# Patient Record
Sex: Male | Born: 1985 | Race: White | Hispanic: No | Marital: Married | Smoking: Former smoker
Health system: Southern US, Community
[De-identification: ages and names within clinical notes are randomized; demographics above are authoritative.]

## PROBLEM LIST (undated history)

## (undated) DIAGNOSIS — K219 Gastro-esophageal reflux disease without esophagitis: Secondary | ICD-10-CM

## (undated) DIAGNOSIS — Z8619 Personal history of other infectious and parasitic diseases: Secondary | ICD-10-CM

## (undated) DIAGNOSIS — A6920 Lyme disease, unspecified: Secondary | ICD-10-CM

## (undated) HISTORY — DX: Gastro-esophageal reflux disease without esophagitis: K21.9

## (undated) HISTORY — PX: ORIF FACIAL FRACTURE: SHX2118

## (undated) HISTORY — PX: WISDOM TOOTH EXTRACTION: SHX21

## (undated) HISTORY — DX: Personal history of other infectious and parasitic diseases: Z86.19

## (undated) HISTORY — DX: Lyme disease, unspecified: A69.20

---
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
## 2013-11-14 ENCOUNTER — Other Ambulatory Visit (HOSPITAL_BASED_OUTPATIENT_CLINIC_OR_DEPARTMENT_OTHER): Payer: Self-pay | Admitting: Osteopathic Medicine

## 2013-11-14 ENCOUNTER — Ambulatory Visit (HOSPITAL_BASED_OUTPATIENT_CLINIC_OR_DEPARTMENT_OTHER)
Admission: RE | Admit: 2013-11-14 | Discharge: 2013-11-14 | Disposition: A | Discharge status: Home / Self Care | Source: Ambulatory Visit | Attending: Osteopathic Medicine | Admitting: Osteopathic Medicine

## 2013-11-14 DIAGNOSIS — N4340 Spermatocele of epididymis, unspecified: Secondary | ICD-10-CM

## 2014-07-13 ENCOUNTER — Ambulatory Visit: Admitting: Physician Assistant

## 2014-07-17 ENCOUNTER — Encounter: Payer: Self-pay | Admitting: Physician Assistant

## 2014-07-17 ENCOUNTER — Ambulatory Visit (INDEPENDENT_AMBULATORY_CARE_PROVIDER_SITE_OTHER): Admitting: Physician Assistant

## 2014-07-17 VITALS — BP 129/71 | HR 65 | Temp 97.9°F | Resp 16 | Ht 74.25 in | Wt 199.1 lb

## 2014-07-17 DIAGNOSIS — N503 Cyst of epididymis: Secondary | ICD-10-CM

## 2014-07-17 DIAGNOSIS — R5382 Chronic fatigue, unspecified: Secondary | ICD-10-CM

## 2014-07-17 DIAGNOSIS — K219 Gastro-esophageal reflux disease without esophagitis: Secondary | ICD-10-CM

## 2014-07-17 LAB — COMPREHENSIVE METABOLIC PANEL
ALT: 33 U/L (ref 0–53)
AST: 21 U/L (ref 0–37)
Albumin: 4.5 g/dL (ref 3.5–5.2)
Alkaline Phosphatase: 58 U/L (ref 39–117)
BUN: 20 mg/dL (ref 6–23)
CO2: 27 mEq/L (ref 19–32)
Calcium: 9.7 mg/dL (ref 8.4–10.5)
Chloride: 104 mEq/L (ref 96–112)
Creatinine, Ser: 1.08 mg/dL (ref 0.40–1.50)
GFR: 87.22 mL/min (ref >60.00)
Glucose, Bld: 75 mg/dL (ref 70–99)
Potassium: 4.1 mEq/L (ref 3.5–5.1)
Sodium: 139 mEq/L (ref 135–145)
Total Bilirubin: 0.5 mg/dL (ref 0.2–1.2)
Total Protein: 7.1 g/dL (ref 6.0–8.3)

## 2014-07-17 LAB — CBC WITH DIFFERENTIAL/PLATELET
Basophils Absolute: 0 10*3/uL (ref 0.0–0.1)
Basophils Relative: 0.6 % (ref 0.0–3.0)
Eosinophils Absolute: 0.1 10*3/uL (ref 0.0–0.7)
Eosinophils Relative: 1.5 % (ref 0.0–5.0)
HCT: 44.8 % (ref 39.0–52.0)
Hemoglobin: 15.4 g/dL (ref 13.0–17.0)
Lymphocytes Relative: 34.9 % (ref 12.0–46.0)
Lymphs Abs: 1.8 10*3/uL (ref 0.7–4.0)
MCHC: 34.4 g/dL (ref 30.0–36.0)
MCV: 86.4 fl (ref 78.0–100.0)
Monocytes Absolute: 0.3 10*3/uL (ref 0.1–1.0)
Monocytes Relative: 5.1 % (ref 3.0–12.0)
Neutro Abs: 3 10*3/uL (ref 1.4–7.7)
Neutrophils Relative %: 57.9 % (ref 43.0–77.0)
Platelets: 185 10*3/uL (ref 150.0–400.0)
RBC: 5.18 Mil/uL (ref 4.22–5.81)
RDW: 12.4 % (ref 11.5–15.5)
WBC: 5.2 10*3/uL (ref 4.0–10.5)

## 2014-07-17 LAB — TSH: TSH: 1.7 u[IU]/mL (ref 0.35–4.50)

## 2014-07-17 NOTE — Progress Notes (Signed)
Patient presents to clinic today to establish care.  Acute Concerns: Patient complains of significant fatigue over the past few months.  Denies trauma or injury.  Denies anxiety or depression but states he feels stressed all the time due to work schedule.  Endorses good diet and fluid intake. Does endorses history of Lyme Disease which he states was treated with Doxycycline.  Denies myalgias or arthralgias.  Patient also endorses R testicular mass that has enlarged over the past couple of months.  Was seen at outside facility for this issue and was sent to our imaging department for US.  Upon record review there is evidence of a 4 mm epididymal cyst/spermatocele noted on R epididymus.  Patient denies tenderness or discomfort.  Is aware he is in the age range where testicular cancer is most common and wants to know if there is testing that can be done to assess this.  Chronic Issues: GERD -- Prilosec 40 mg daily.  Denies breakthrough symptoms -- nausea/vomiting or abdominal pain. Is trying to watch diet.  Is staying active.  Past Medical History  Diagnosis Date  . Acid reflux   . History of chicken pox   . Lyme disease     Unknown    Past Surgical History  Procedure Laterality Date  . Wisdom tooth extraction    . Orif facial fracture      Left Cheek    No current outpatient prescriptions on file prior to visit.   No current facility-administered medications on file prior to visit.    No Known Allergies  Family History  Problem Relation Age of Onset  . Healthy Mother     Living  . Healthy Father     Living  . Lung cancer Maternal Grandfather   . Throat cancer Maternal Grandfather   . Cancer Maternal Uncle   . Healthy Brother     x3    History   Social History  . Marital Status: Single    Spouse Name: N/A  . Number of Children: N/A  . Years of Education: N/A   Occupational History  . Not on file.   Social History Main Topics  . Smoking status: Former Smoker      Types: Cigarettes  . Smokeless tobacco: Never Used     Comment: Quit >5 yrs  . Alcohol Use: 0.0 oz/week    0 Standard drinks or equivalent per week     Comment: rare  . Drug Use: No  . Sexual Activity: Not on file   Other Topics Concern  . Not on file   Social History Narrative  . No narrative on file   ROS Pertinent ROS are listed in the HPI.  BP 129/71 mmHg  Pulse 65  Temp(Src) 97.9 F (36.6 C) (Oral)  Resp 16  Ht 6' 2.25" (1.886 m)  Wt 199 lb 2 oz (90.323 kg)  BMI 25.39 kg/m2  SpO2 100%  Physical Exam  Constitutional: He is oriented to person, place, and time and well-developed, well-nourished, and in no distress.  HENT:  Head: Normocephalic and atraumatic.  Eyes: Conjunctivae are normal.  Neck: Neck supple. No thyromegaly present.  Cardiovascular: Normal rate, regular rhythm, normal heart sounds and intact distal pulses.   Pulmonary/Chest: Effort normal and breath sounds normal. No respiratory distress. He has no wheezes. He has no rales. He exhibits no tenderness.  Genitourinary: Penis normal. He exhibits abnormal testicular mass. He exhibits no testicular tenderness, no scrotal tenderness and no epididymal tenderness. No discharge  found.  Lymphadenopathy:    He has no cervical adenopathy.  Neurological: He is alert and oriented to person, place, and time.  Skin: Skin is warm and dry. No rash noted.  Psychiatric: Affect normal.  Vitals reviewed.  Assessment/Plan: GERD (gastroesophageal reflux disease) Well-controlled.  Continue current regimen.  GERD diet discussed.   Epididymal cyst Noted on [place].  Lesion still present. Asymptomatic. Due to patient concerns, will obtain a urine pregnancy test to make sure negative for HCG.     Chronic fatigue Unclear -- likely due to stress.  Will obtain lab workup today.  If unremarkable, will refer to Psychology for formal ADD testing.

## 2014-07-17 NOTE — Assessment & Plan Note (Signed)
Unclear -- likely due to stress.  Will obtain lab workup today.  If unremarkable, will refer to Psychology for formal ADD testing.

## 2014-07-17 NOTE — Assessment & Plan Note (Signed)
Well-controlled.  Continue current regimen.  GERD diet discussed.

## 2014-07-17 NOTE — Assessment & Plan Note (Signed)
Noted on US.  Lesion still present. Asymptomatic. Due to patient concerns, will obtain a urine pregnancy test to make sure negative for HCG.

## 2014-07-17 NOTE — Progress Notes (Signed)
Pre visit review using our clinic review tool, if applicable. No additional management support is needed unless otherwise documented below in the visit note/SLS  

## 2014-07-17 NOTE — Patient Instructions (Signed)
Please go to the lab for blood work and a urine test.  I will call you with all of your results.  If a cause is found for your fatigue, we will treat you accordingly.  If all looks good, I recommend we proceed with the ADD testing.   Follow-up will be based on your results.

## 2014-07-18 LAB — PREGNANCY, URINE: Preg Test, Ur: NEGATIVE

## 2014-07-20 ENCOUNTER — Telehealth: Payer: Self-pay

## 2014-07-20 LAB — EPSTEIN-BARR VIRUS NUCLEAR ANTIGEN ANTIBODY, IGG: EBV NA IgG: 79.9 U/mL — ABNORMAL HIGH (ref <18.0)

## 2014-07-20 LAB — B. BURGDORFI ANTIBODIES: B burgdorferi Ab IgG+IgM: 0.47 {ISR}

## 2014-07-20 MED ORDER — OMEPRAZOLE 40 MG PO CPDR: 40.0000 mg | DELAYED_RELEASE_CAPSULE | Freq: Every day | ORAL | Status: DC

## 2014-07-20 NOTE — Telephone Encounter (Signed)
omeprazole (PRILOSEC) 40 MG capsule--Take 40 mg by mouth daily. - Oral  [facility] [address] per patient's request.

## 2014-07-21 ENCOUNTER — Ambulatory Visit: Admitting: Physician Assistant

## 2014-07-28 ENCOUNTER — Telehealth: Payer: Self-pay | Admitting: Physician Assistant

## 2014-07-28 DIAGNOSIS — F988 Other specified behavioral and emotional disorders with onset usually occurring in childhood and adolescence: Secondary | ICD-10-CM

## 2014-07-28 NOTE — Telephone Encounter (Signed)
Unfortunately it will have to run its course.  Can last up to 6 weeks.  Recommend he take a multivitamin, a probiotic and a zinc supplement. Order placed to psychology for further ADD testing.

## 2014-07-28 NOTE — Telephone Encounter (Signed)
LMOM with contact name and number for return call, if needed RE: mono symptoms, referrals and further provider instructions/SLS

## 2014-07-28 NOTE — Telephone Encounter (Signed)
Caller name: Chrissie Noawilliam Relation to pt: self Call back number: [phone] Pharmacy:  Reason for call:   Patient states that he was diagnosed with mono and is having a hard time with day to day work. He wants to know if there is anything that he can take to help the fatigue and to speed this illness up so he can feel better  Also, patient states that he would like to proceed with ADHD testing.

## 2014-08-10 ENCOUNTER — Encounter: Payer: Self-pay | Admitting: Physician Assistant

## 2014-08-10 ENCOUNTER — Ambulatory Visit (INDEPENDENT_AMBULATORY_CARE_PROVIDER_SITE_OTHER): Payer: Self-pay | Admitting: Physician Assistant

## 2014-08-10 VITALS — BP 124/72 | HR 66 | Temp 98.1°F | Resp 16 | Ht 74.25 in | Wt 198.1 lb

## 2014-08-10 DIAGNOSIS — R4589 Other symptoms and signs involving emotional state: Secondary | ICD-10-CM

## 2014-08-10 DIAGNOSIS — Z202 Contact with and (suspected) exposure to infections with a predominantly sexual mode of transmission: Secondary | ICD-10-CM

## 2014-08-10 DIAGNOSIS — F329 Major depressive disorder, single episode, unspecified: Secondary | ICD-10-CM

## 2014-08-10 LAB — POCT URINALYSIS DIPSTICK
Bilirubin, UA: NEGATIVE
Glucose, UA: NEGATIVE
Ketones, UA: NEGATIVE
Leukocytes, UA: NEGATIVE
NITRITE UA: NEGATIVE
PROTEIN UA: NEGATIVE
RBC UA: NEGATIVE
SPEC GRAV UA: 1.015
Urobilinogen, UA: 0.2
pH, UA: 7

## 2014-08-10 MED ORDER — AZITHROMYCIN 500 MG PO TABS
1000.0000 mg | ORAL_TABLET | Freq: Once | ORAL | Status: AC
  Administered 2014-08-10: 1000 mg via ORAL

## 2014-08-10 MED ORDER — SERTRALINE HCL 25 MG PO TABS: 25.0000 mg | ORAL_TABLET | Freq: Every day | ORAL | Status: DC

## 2014-08-10 NOTE — Progress Notes (Signed)
Patient c/o penile discomfort and irritation over the past few days.  Denies drainage but endorses some urinary urgency.  Denies hematuria, nausea or vomiting.  Denies abdominal pain.  Denies testicular pain or swelling.  Is sexually active with his girlfriend.  No protection.  Is concerned about STI because he has contracted chlamydia from his girlfriend before and has some concerns about infidelity.  Patient also wishes to readdress depressed mood, mostly due to significant stress at work. Endorses feeling down most days of the week.  Endorses anhedonia.  Denies S/HI.  Is open to medication.  Past Medical History  Diagnosis Date  . Acid reflux   . History of chicken pox   . Lyme disease     Unknown    Current Outpatient Prescriptions on File Prior to Visit  Medication Sig Dispense Refill  . omeprazole (PRILOSEC) 40 MG capsule Take 1 capsule (40 mg total) by mouth daily. 30 capsule 5   No current facility-administered medications on file prior to visit.    No Known Allergies  Family History  Problem Relation Age of Onset  . Healthy Mother     Living  . Healthy Father     Living  . Lung cancer Maternal Grandfather   . Throat cancer Maternal Grandfather   . Cancer Maternal Uncle   . Healthy Brother     x3    History   Social History  . Marital Status: Single    Spouse Name: N/A  . Number of Children: N/A  . Years of Education: N/A   Social History Main Topics  . Smoking status: Former Smoker    Types: Cigarettes  . Smokeless tobacco: Never Used     Comment: Quit >5 yrs  . Alcohol Use: 0.0 oz/week    0 Standard drinks or equivalent per week     Comment: rare  . Drug Use: No  . Sexual Activity: Not on file   Other Topics Concern  . None   Social History Narrative    Review of Systems - See HPI.  All other ROS are negative.  BP 124/72 mmHg  Pulse 66  Temp(Src) 98.1 F (36.7 C) (Oral)  Resp 16  Ht 6' 2.25" (1.886 m)  Wt 198 lb 2 oz (89.869 kg)  BMI  25.27 kg/m2  SpO2 100%  Physical Exam  Constitutional: He is oriented to person, place, and time and well-developed, well-nourished, and in no distress.  HENT:  Head: Normocephalic and atraumatic.  Eyes: Conjunctivae are normal.  Cardiovascular: Normal rate, regular rhythm, normal heart sounds and intact distal pulses.   Pulmonary/Chest: Effort normal and breath sounds normal. No respiratory distress. He has no wheezes. He has no rales. He exhibits no tenderness.  Genitourinary: Testes/scrotum normal. He exhibits no abnormal testicular mass, no testicular tenderness, no abnormal scrotal mass, no scrotal tenderness and no epididymal tenderness. Cremasteric reflex is present. No discharge found.  Pain with palpation over the penile shaft.  Neurological: He is alert and oriented to person, place, and time.  Skin: Skin is warm and dry. No rash noted.  Psychiatric: He exhibits a depressed mood.  Vitals reviewed.   Recent Results (from the past 2160 hour(s))  CBC w/Diff     Status: None   Collection Time: 07/17/14  8:44 AM  Result Value Ref Range   WBC 5.2 4.0 - 10.5 K/uL   RBC 5.18 4.22 - 5.81 Mil/uL   Hemoglobin 15.4 13.0 - 17.0 g/dL   HCT 44.8 39.0 -  52.0 %   MCV 86.4 78.0 - 100.0 fl   MCHC 34.4 30.0 - 36.0 g/dL   RDW 12.4 11.5 - 15.5 %   Platelets 185.0 150.0 - 400.0 K/uL   Neutrophils Relative % 57.9 43.0 - 77.0 %   Lymphocytes Relative 34.9 12.0 - 46.0 %   Monocytes Relative 5.1 3.0 - 12.0 %   Eosinophils Relative 1.5 0.0 - 5.0 %   Basophils Relative 0.6 0.0 - 3.0 %   Neutro Abs 3.0 1.4 - 7.7 K/uL   Lymphs Abs 1.8 0.7 - 4.0 K/uL   Monocytes Absolute 0.3 0.1 - 1.0 K/uL   Eosinophils Absolute 0.1 0.0 - 0.7 K/uL   Basophils Absolute 0.0 0.0 - 0.1 K/uL  Comp Met (CMET)     Status: None   Collection Time: 07/17/14  8:44 AM  Result Value Ref Range   Sodium 139 135 - 145 mEq/L   Potassium 4.1 3.5 - 5.1 mEq/L   Chloride 104 96 - 112 mEq/L   CO2 27 19 - 32 mEq/L   Glucose, Bld  75 70 - 99 mg/dL   BUN 20 6 - 23 mg/dL   Creatinine, Ser 1.08 0.40 - 1.50 mg/dL   Total Bilirubin 0.5 0.2 - 1.2 mg/dL   Alkaline Phosphatase 58 39 - 117 U/L   AST 21 0 - 37 U/L   ALT 33 0 - 53 U/L   Total Protein 7.1 6.0 - 8.3 g/dL   Albumin 4.5 3.5 - 5.2 g/dL   Calcium 9.7 8.4 - 10.5 mg/dL   GFR 87.22 >60.00 mL/min  TSH     Status: None   Collection Time: 07/17/14  8:44 AM  Result Value Ref Range   TSH 1.70 0.35 - 4.50 uIU/mL  Epstein-Barr virus nuclear antigen antibody, IgG     Status: Abnormal   Collection Time: 07/17/14  8:44 AM  Result Value Ref Range   EBV NA IgG 79.9 (H) <18.0 U/mL    Comment:   Reference Range:       <18.0 U/mL = Negative                    18.0-21.9 U/mL = Equivocal                       >=22.0 U/mL = Positive          Clinical Stage            VCA IgG   VCA IgM      EA    EBV NA          Susceptibility               -         -         -       -        Very Early Infection        +/-       +/-        -       -        Established Infection        +         +        +/-      -        Recent Infection             +         +        +/-     +/-  Past Infection               +         -        +/-      +                                                                               +/- means positive or negative (not weak) High persisting antibody levels may be present in Burkitt's lymphoma and nasopharyngeal carcinoma.   B. Burgdorfi Antibodies     Status: None   Collection Time: 07/17/14  8:44 AM  Result Value Ref Range   B burgdorferi Ab IgG+IgM 0.47 ISR    Comment: Antibody to Borrelia burgdorferi not detected.      ISR = Immune Status Ratio              <0.90         ISR       Negative              0.90 - 1.09   ISR       Equivocal              >=1.10        ISR       Positive   Pregnancy, urine     Status: None   Collection Time: 07/17/14  8:54 AM  Result Value Ref Range   Preg Test, Ur NEG     Comment: The sensitivity for this  methodology is >24 mIU/mL.     POCT urinalysis dipstick     Status: None   Collection Time: 08/10/14  5:17 PM  Result Value Ref Range   Color, UA straw    Clarity, UA clear    Glucose, UA neg    Bilirubin, UA neg    Ketones, UA neg    Spec Grav, UA 1.015    Blood, UA neg    pH, UA 7.0    Protein, UA neg    Urobilinogen, UA 0.2    Nitrite, UA neg    Leukocytes, UA Negative     Assessment/Plan: Exposure to chlamydia Patient with + history from same partner.  Will obtain urine GC/chlamydia.  Empirically treated with 1 g Azithromycin in office.  Discussed full STI panel but patient wishes to hold off until these initial results come in.  Urine dip negative. Sent for culture. Safe sex practices discussed with patient.   Depressed mood Discussed behavioral therapy versus pharmacotherapy. Patient wishes to proceed with medication.  Will begin Sertraline 25 mg daily.  Potential side effects discussed with patient.  Alarm signs/symptoms discussed with patient.  Follow-up in 1 month.

## 2014-08-10 NOTE — Progress Notes (Signed)
Pre visit review using our clinic review tool, if applicable. No additional management support is needed unless otherwise documented below in the visit note/SLS  

## 2014-08-10 NOTE — Patient Instructions (Signed)
Please take Azithromycin tablet as directed now.  I will call you with your results.  Please start the Sertraline as directed.  Follow-up in 1 month.  Let me know if anything worsens while on this medication.

## 2014-08-11 DIAGNOSIS — Z202 Contact with and (suspected) exposure to infections with a predominantly sexual mode of transmission: Secondary | ICD-10-CM | POA: Insufficient documentation

## 2014-08-11 DIAGNOSIS — R4589 Other symptoms and signs involving emotional state: Secondary | ICD-10-CM | POA: Insufficient documentation

## 2014-08-11 DIAGNOSIS — F329 Major depressive disorder, single episode, unspecified: Principal | ICD-10-CM

## 2014-08-11 LAB — GC/CHLAMYDIA PROBE AMP, URINE
Chlamydia, Swab/Urine, PCR: NEGATIVE
GC Probe Amp, Urine: NEGATIVE

## 2014-08-11 NOTE — Assessment & Plan Note (Signed)
Patient with + history from same partner.  Will obtain urine GC/chlamydia.  Empirically treated with 1 g Azithromycin in office.  Discussed full STI panel but patient wishes to hold off until these initial results come in.  Urine dip negative. Sent for culture. Safe sex practices discussed with patient.

## 2014-08-11 NOTE — Assessment & Plan Note (Signed)
Discussed behavioral therapy versus pharmacotherapy. Patient wishes to proceed with medication.  Will begin Sertraline 25 mg daily.  Potential side effects discussed with patient.  Alarm signs/symptoms discussed with patient.  Follow-up in 1 month.

## 2014-08-12 LAB — CULTURE, URINE COMPREHENSIVE
Colony Count: NO GROWTH
ORGANISM ID, BACTERIA: NO GROWTH

## 2014-08-26 ENCOUNTER — Telehealth: Payer: Self-pay | Admitting: Physician Assistant

## 2014-08-26 DIAGNOSIS — R4589 Other symptoms and signs involving emotional state: Secondary | ICD-10-CM

## 2014-08-26 DIAGNOSIS — F329 Major depressive disorder, single episode, unspecified: Principal | ICD-10-CM

## 2014-08-26 MED ORDER — SERTRALINE HCL 50 MG PO TABS: 50.0000 mg | ORAL_TABLET | Freq: Every day | ORAL | Status: DC

## 2014-08-26 NOTE — Telephone Encounter (Signed)
Notified pt and he voices understanding. 

## 2014-08-26 NOTE — Telephone Encounter (Signed)
Please Advise

## 2014-08-26 NOTE — Telephone Encounter (Signed)
Caller name: Chrissie Noawilliam Relation to pt: self Call back number: [phone] [facility] [address]  Reason for call:   Patient states that he has not noticed any difference while taking zoloft. He would like to know if the dosage could be increased?

## 2014-08-26 NOTE — Telephone Encounter (Signed)
Ok to increase to 50 mg daily.  Can take two of the 25 mg tablets he currently has.  New Rx will be for 50 mg tablet.

## 2014-09-02 ENCOUNTER — Encounter: Payer: Self-pay | Admitting: Physician Assistant

## 2014-09-02 ENCOUNTER — Ambulatory Visit (INDEPENDENT_AMBULATORY_CARE_PROVIDER_SITE_OTHER): Payer: Self-pay | Admitting: Physician Assistant

## 2014-09-02 VITALS — BP 114/73 | HR 59 | Temp 98.0°F | Resp 16 | Ht 74.0 in | Wt 199.0 lb

## 2014-09-02 DIAGNOSIS — R5382 Chronic fatigue, unspecified: Secondary | ICD-10-CM

## 2014-09-02 LAB — CBC
HEMATOCRIT: 44.3 % (ref 39.0–52.0)
HEMOGLOBIN: 15 g/dL (ref 13.0–17.0)
MCHC: 33.8 g/dL (ref 30.0–36.0)
MCV: 86.9 fl (ref 78.0–100.0)
PLATELETS: 176 10*3/uL (ref 150.0–400.0)
RBC: 5.1 Mil/uL (ref 4.22–5.81)
RDW: 12.9 % (ref 11.5–15.5)
WBC: 4.5 10*3/uL (ref 4.0–10.5)

## 2014-09-02 LAB — VITAMIN B12: Vitamin B-12: 607 pg/mL (ref 211–911)

## 2014-09-02 LAB — TESTOSTERONE: Testosterone: 313.56 ng/dL (ref 300.00–890.00)

## 2014-09-02 LAB — SEDIMENTATION RATE: Sed Rate: 2 mm/hr (ref 0–22)

## 2014-09-02 NOTE — Assessment & Plan Note (Signed)
With recent Mono infection. Should be resolving by now.  Other previous labs unremarkable.  Concerned either there is something else underlying that is contributing or he is having atypical side effect to sertraline.  Will obtain further workup to include repeat CBC, B12, Vitamin D, Sed rate and testosterone level.  Will begin to wean off of sertraline -- take 25 mg daily for 1 week before stopping medication.

## 2014-09-02 NOTE — Progress Notes (Signed)
Patient presents to clinic today for follow-up of fatigue. Endorses fatigue remains despite increased hydration, well-rounded diet and taking sertraline 50 mg daily.  Denies improvement in mood.  Denies worsening mood.  Does endorse anorgasmia since increase in sertraline dosage.  Past Medical History  Diagnosis Date  . Acid reflux   . History of chicken pox   . Lyme disease     Unknown    Current Outpatient Prescriptions on File Prior to Visit  Medication Sig Dispense Refill  . Multiple Vitamin (MULTIVITAMIN) tablet Take 1 tablet by mouth daily.    Marland Kitchen omeprazole (PRILOSEC) 40 MG capsule Take 1 capsule (40 mg total) by mouth daily. 30 capsule 5  . sertraline (ZOLOFT) 50 MG tablet Take 1 tablet (50 mg total) by mouth daily. 30 tablet 1   No current facility-administered medications on file prior to visit.    No Known Allergies  Family History  Problem Relation Age of Onset  . Healthy Mother     Living  . Healthy Father     Living  . Lung cancer Maternal Grandfather   . Throat cancer Maternal Grandfather   . Cancer Maternal Uncle   . Healthy Brother     x3    History   Social History  . Marital Status: Single    Spouse Name: N/A  . Number of Children: N/A  . Years of Education: N/A   Social History Main Topics  . Smoking status: Former Smoker    Types: Cigarettes  . Smokeless tobacco: Never Used     Comment: Quit >5 yrs  . Alcohol Use: 0.0 oz/week    0 Standard drinks or equivalent per week     Comment: rare  . Drug Use: No  . Sexual Activity: Not on file   Other Topics Concern  . None   Social History Narrative   Review of Systems - See HPI.  All other ROS are negative.  BP 114/73 mmHg  Pulse 59  Temp(Src) 98 F (36.7 C) (Oral)  Resp 16  Ht '6\' 2"'  (1.88 m)  Wt 199 lb (90.266 kg)  BMI 25.54 kg/m2  SpO2 100%  Physical Exam  Constitutional: He is oriented to person, place, and time and well-developed, well-nourished, and in no distress.  HENT:    Head: Normocephalic and atraumatic.  Eyes: Conjunctivae are normal.  Neck: Neck supple.  Cardiovascular: Normal rate, regular rhythm, normal heart sounds and intact distal pulses.   Pulmonary/Chest: Effort normal and breath sounds normal. No respiratory distress. He has no wheezes. He has no rales. He exhibits no tenderness.  Neurological: He is alert and oriented to person, place, and time.  Skin: Skin is warm and dry. No rash noted.  Psychiatric: Affect normal.  Vitals reviewed.   Recent Results (from the past 2160 hour(s))  CBC w/Diff     Status: None   Collection Time: 07/17/14  8:44 AM  Result Value Ref Range   WBC 5.2 4.0 - 10.5 K/uL   RBC 5.18 4.22 - 5.81 Mil/uL   Hemoglobin 15.4 13.0 - 17.0 g/dL   HCT 44.8 39.0 - 52.0 %   MCV 86.4 78.0 - 100.0 fl   MCHC 34.4 30.0 - 36.0 g/dL   RDW 12.4 11.5 - 15.5 %   Platelets 185.0 150.0 - 400.0 K/uL   Neutrophils Relative % 57.9 43.0 - 77.0 %   Lymphocytes Relative 34.9 12.0 - 46.0 %   Monocytes Relative 5.1 3.0 - 12.0 %   Eosinophils  Relative 1.5 0.0 - 5.0 %   Basophils Relative 0.6 0.0 - 3.0 %   Neutro Abs 3.0 1.4 - 7.7 K/uL   Lymphs Abs 1.8 0.7 - 4.0 K/uL   Monocytes Absolute 0.3 0.1 - 1.0 K/uL   Eosinophils Absolute 0.1 0.0 - 0.7 K/uL   Basophils Absolute 0.0 0.0 - 0.1 K/uL  Comp Met (CMET)     Status: None   Collection Time: 07/17/14  8:44 AM  Result Value Ref Range   Sodium 139 135 - 145 mEq/L   Potassium 4.1 3.5 - 5.1 mEq/L   Chloride 104 96 - 112 mEq/L   CO2 27 19 - 32 mEq/L   Glucose, Bld 75 70 - 99 mg/dL   BUN 20 6 - 23 mg/dL   Creatinine, Ser 1.08 0.40 - 1.50 mg/dL   Total Bilirubin 0.5 0.2 - 1.2 mg/dL   Alkaline Phosphatase 58 39 - 117 U/L   AST 21 0 - 37 U/L   ALT 33 0 - 53 U/L   Total Protein 7.1 6.0 - 8.3 g/dL   Albumin 4.5 3.5 - 5.2 g/dL   Calcium 9.7 8.4 - 10.5 mg/dL   GFR 87.22 >60.00 mL/min  TSH     Status: None   Collection Time: 07/17/14  8:44 AM  Result Value Ref Range   TSH 1.70 0.35 - 4.50  uIU/mL  Epstein-Barr virus nuclear antigen antibody, IgG     Status: Abnormal   Collection Time: 07/17/14  8:44 AM  Result Value Ref Range   EBV NA IgG 79.9 (H) <18.0 U/mL    Comment:   Reference Range:       <18.0 U/mL = Negative                    18.0-21.9 U/mL = Equivocal                       >=22.0 U/mL = Positive          Clinical Stage            VCA IgG   VCA IgM      EA    EBV NA          Susceptibility               -         -         -       -        Very Early Infection        +/-       +/-        -       -        Established Infection        +         +        +/-      -        Recent Infection             +         +        +/-     +/-        Past Infection               +         -        +/-      +                                                                               +/-  means positive or negative (not weak) High persisting antibody levels may be present in Burkitt's lymphoma and nasopharyngeal carcinoma.   B. Burgdorfi Antibodies     Status: None   Collection Time: 07/17/14  8:44 AM  Result Value Ref Range   B burgdorferi Ab IgG+IgM 0.47 ISR    Comment: Antibody to Borrelia burgdorferi not detected.      ISR = Immune Status Ratio              <0.90         ISR       Negative              0.90 - 1.09   ISR       Equivocal              >=1.10        ISR       Positive   Pregnancy, urine     Status: None   Collection Time: 07/17/14  8:54 AM  Result Value Ref Range   Preg Test, Ur NEG     Comment: The sensitivity for this methodology is >24 mIU/mL.     GC/chlamydia probe amp, urine     Status: None   Collection Time: 08/10/14  4:46 PM  Result Value Ref Range   Chlamydia, Swab/Urine, PCR NEGATIVE NEGATIVE    Comment:                      **Normal Reference Range: Negative**          Assay performed using the Gen-Probe APTIMA COMBO2 (R) Assay.      GC Probe Amp, Urine NEGATIVE NEGATIVE    Comment:                      **Normal Reference Range:  Negative**          Assay performed using the Gen-Probe APTIMA COMBO2 (R) Assay.     CULTURE, URINE COMPREHENSIVE     Status: None   Collection Time: 08/10/14  4:46 PM  Result Value Ref Range   Colony Count NO GROWTH    Organism ID, Bacteria NO GROWTH   POCT urinalysis dipstick     Status: None   Collection Time: 08/10/14  5:17 PM  Result Value Ref Range   Color, UA straw    Clarity, UA clear    Glucose, UA neg    Bilirubin, UA neg    Ketones, UA neg    Spec Grav, UA 1.015    Blood, UA neg    pH, UA 7.0    Protein, UA neg    Urobilinogen, UA 0.2    Nitrite, UA neg    Leukocytes, UA Negative    Assessment/Plan: Chronic fatigue With recent Mono infection. Should be resolving by now.  Other previous labs unremarkable.  Concerned either there is something else underlying that is contributing or he is having atypical side effect to sertraline.  Will obtain further workup to include repeat CBC, B12, Vitamin D, Sed rate and testosterone level.  Will begin to wean off of sertraline -- take 25 mg daily for 1 week before stopping medication.

## 2014-09-02 NOTE — Patient Instructions (Signed)
Please stop by the lab for blood work. Wean down to 1/2 tablet of Sertraline daily for 1 week.  Then stop the medication. We will see if the medication is adding to your symptoms. I will call you with your results. Stay active and eat 3 meals per day.

## 2014-09-02 NOTE — Progress Notes (Signed)
Pre visit review using our clinic review tool, if applicable. No additional management support is needed unless otherwise documented below in the visit note. 

## 2014-09-06 LAB — VITAMIN D 1,25 DIHYDROXY
Vitamin D 1, 25 (OH)2 Total: 54 pg/mL (ref 18–72)
Vitamin D2 1, 25 (OH)2: <8 pg/mL
Vitamin D3 1, 25 (OH)2: 54 pg/mL

## 2014-09-11 ENCOUNTER — Ambulatory Visit: Admitting: Physician Assistant

## 2014-10-06 ENCOUNTER — Ambulatory Visit: Payer: Self-pay | Admitting: Physician Assistant

## 2015-05-03 ENCOUNTER — Ambulatory Visit (INDEPENDENT_AMBULATORY_CARE_PROVIDER_SITE_OTHER): Admitting: Physician Assistant

## 2015-05-03 ENCOUNTER — Encounter: Payer: Self-pay | Admitting: Physician Assistant

## 2015-05-03 VITALS — BP 136/83 | HR 69 | Temp 98.0°F | Ht 74.0 in | Wt 211.2 lb

## 2015-05-03 DIAGNOSIS — J069 Acute upper respiratory infection, unspecified: Secondary | ICD-10-CM

## 2015-05-03 DIAGNOSIS — K219 Gastro-esophageal reflux disease without esophagitis: Secondary | ICD-10-CM

## 2015-05-03 MED ORDER — HYDROCOD POLST-CPM POLST ER 10-8 MG/5ML PO SUER: 5.0000 mL | Freq: Two times a day (BID) | ORAL | Status: DC | PRN

## 2015-05-03 MED ORDER — OMEPRAZOLE 40 MG PO CPDR: 40.0000 mg | DELAYED_RELEASE_CAPSULE | Freq: Every day | ORAL | Status: DC

## 2015-05-03 NOTE — Assessment & Plan Note (Signed)
Exam unremarkable. Suspect viral etiology. Supportive measures reviewed. Continue MTV. Start probiotic. Rx Tussionex. Call or return to clinic if symptoms are not resolving.

## 2015-05-03 NOTE — Progress Notes (Signed)
Pre visit review using our clinic review tool, if applicable. No additional management support is needed unless otherwise documented below in the visit note. 

## 2015-05-03 NOTE — Assessment & Plan Note (Signed)
Medication refilled now that insurance is back in the picture.

## 2015-05-03 NOTE — Patient Instructions (Signed)
Please stay well hydrated. Get plenty of rest. Continue your multivitamin but start a probiotic daily. Use saline nasal spray to flush sinuses. Take cough syrup as directed.  Call if symptoms not improving/resolving by Friday.  Upper Respiratory Infection, Adult Most upper respiratory infections (URIs) are caused by a virus. A URI affects the nose, throat, and upper air passages. The most common type of URI is often called "the common cold." HOME CARE   Take medicines only as told by your doctor.  Gargle warm saltwater or take cough drops to comfort your throat as told by your doctor.  Use a warm mist humidifier or inhale steam from a shower to increase air moisture. This may make it easier to breathe.  Drink enough fluid to keep your pee (urine) clear or pale yellow.  Eat soups and other clear broths.  Have a healthy diet.  Rest as needed.  Go back to work when your fever is gone or your doctor says it is okay.  You may need to stay home longer to avoid giving your URI to others.  You can also wear a face mask and wash your hands often to prevent spread of the virus.  Use your inhaler more if you have asthma.  Do not use any tobacco products, including cigarettes, chewing tobacco, or electronic cigarettes. If you need help quitting, ask your doctor. GET HELP IF:  You are getting worse, not better.  Your symptoms are not helped by medicine.  You have chills.  You are getting more short of breath.  You have brown or red mucus.  You have yellow or brown discharge from your nose.  You have pain in your face, especially when you bend forward.  You have a fever.  You have puffy (swollen) neck glands.  You have pain while swallowing.  You have white areas in the back of your throat. GET HELP RIGHT AWAY IF:   You have very bad or constant:  Headache.  Ear pain.  Pain in your forehead, behind your eyes, and over your cheekbones (sinus pain).  Chest  pain.  You have long-lasting (chronic) lung disease and any of the following:  Wheezing.  Long-lasting cough.  Coughing up blood.  A change in your usual mucus.  You have a stiff neck.  You have changes in your:  Vision.  Hearing.  Thinking.  Mood. MAKE SURE YOU:   Understand these instructions.  Will watch your condition.  Will get help right away if you are not doing well or get worse.   This information is not intended to replace advice given to you by your health care provider. Make sure you discuss any questions you have with your health care provider.   Document Released: 11/01/2007 Document Revised: 09/29/2014 Document Reviewed: 08/20/2013 Elsevier Interactive Patient Education [employer]2016 [employer] Inc.

## 2015-05-03 NOTE — Progress Notes (Signed)
   Patient presents to clinic today c/o scratchy throat with dry cough that is worse at night. Symptoms started 3 days ago. Symptoms dwindle during the day when he is active. Denies fever, chest congestion. Denies recent travel. Endorses father with similar symptoms.  Past Medical History  Diagnosis Date  . Acid reflux   . History of chicken pox   . Lyme disease     Unknown    Current Outpatient Prescriptions on File Prior to Visit  Medication Sig Dispense Refill  . Multiple Vitamin (MULTIVITAMIN) tablet Take 1 tablet by mouth daily.     No current facility-administered medications on file prior to visit.    No Known Allergies  Family History  Problem Relation Age of Onset  . Healthy Mother     Living  . Healthy Father     Living  . Lung cancer Maternal Grandfather   . Throat cancer Maternal Grandfather   . Cancer Maternal Uncle   . Healthy Brother     x3    Social History   Social History  . Marital Status: Single    Spouse Name: N/A  . Number of Children: N/A  . Years of Education: N/A   Social History Main Topics  . Smoking status: Former Smoker    Types: Cigarettes  . Smokeless tobacco: Never Used     Comment: Quit >5 yrs  . Alcohol Use: 0.0 oz/week    0 Standard drinks or equivalent per week     Comment: rare  . Drug Use: No  . Sexual Activity: Not Asked   Other Topics Concern  . None   Social History Narrative   Review of Systems - See HPI.  All other ROS are negative.  BP 136/83 mmHg  Pulse 69  Temp(Src) 98 F (36.7 C) (Oral)  Ht 6\' 2"  (1.88 m)  Wt 211 lb 3.2 oz (95.8 kg)  BMI 27.11 kg/m2  SpO2 99%  Physical Exam  Constitutional: He is oriented to person, place, and time and well-developed, well-nourished, and in no distress.  HENT:  Head: Normocephalic and atraumatic.  Right Ear: External ear normal.  Left Ear: External ear normal.  Nose: Nose normal.  Mouth/Throat: Oropharynx is clear and moist. No oropharyngeal exudate.  TM within  normal limits   Eyes: Conjunctivae are normal.  Neck: Neck supple.  Cardiovascular: Normal rate, regular rhythm, normal heart sounds and intact distal pulses.   Pulmonary/Chest: Effort normal and breath sounds normal. No respiratory distress. He has no wheezes. He has no rales. He exhibits no tenderness.  Neurological: He is alert and oriented to person, place, and time.  Skin: Skin is warm and dry. No rash noted.  Psychiatric: Affect normal.  Vitals reviewed.  No results found for this or any previous visit (from the past 2160 hour(s)).  Assessment/Plan: Viral URI Exam unremarkable. Suspect viral etiology. Supportive measures reviewed. Continue MTV. Start probiotic. Rx Tussionex. Call or return to clinic if symptoms are not resolving.  GERD (gastroesophageal reflux disease) Medication refilled now that insurance is back in the picture.

## 2015-06-07 ENCOUNTER — Ambulatory Visit (INDEPENDENT_AMBULATORY_CARE_PROVIDER_SITE_OTHER): Admitting: Physician Assistant

## 2015-06-07 ENCOUNTER — Ambulatory Visit (HOSPITAL_BASED_OUTPATIENT_CLINIC_OR_DEPARTMENT_OTHER)
Admission: RE | Admit: 2015-06-07 | Discharge: 2015-06-07 | Disposition: A | Discharge status: Home / Self Care | Source: Ambulatory Visit | Attending: Physician Assistant | Admitting: Physician Assistant

## 2015-06-07 ENCOUNTER — Encounter: Payer: Self-pay | Admitting: Physician Assistant

## 2015-06-07 VITALS — BP 138/70 | HR 66 | Temp 97.7°F | Ht 74.0 in | Wt 214.6 lb

## 2015-06-07 DIAGNOSIS — M545 Low back pain, unspecified: Secondary | ICD-10-CM | POA: Insufficient documentation

## 2015-06-07 MED ORDER — DICLOFENAC SODIUM 75 MG PO TBEC
75.0000 mg | DELAYED_RELEASE_TABLET | Freq: Two times a day (BID) | ORAL | Status: DC
Start: 2015-06-07 — End: 2015-07-21

## 2015-06-07 NOTE — Patient Instructions (Signed)
Please go downstairs for imaging. I will call with your results. Start Voltaren as directed with food.  Apply topical Aspercreme or Icy Hot to the area. Limit heavy lifting when possible. We will know more once imaging has been obtained.

## 2015-06-07 NOTE — Progress Notes (Signed)
Pre visit review using our clinic review tool, if applicable. No additional management support is needed unless otherwise documented below in the visit note. 

## 2015-06-07 NOTE — Progress Notes (Signed)
   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 Has had intermittent pain since than off an on but pain has been daily and more severe over the past 6 months. Pain is described as aching in nature. Is not radiating anywhere. Denies lower extremity weakness.   Past Medical History  Diagnosis Date  . Acid reflux   . History of chicken pox   . Lyme disease     Unknown    Current Outpatient Prescriptions on File Prior to Visit  Medication Sig Dispense Refill  . Multiple Vitamin (MULTIVITAMIN) tablet Take 1 tablet by mouth daily.    Marland Kitchen. omeprazole (PRILOSEC) 40 MG capsule Take 1 capsule (40 mg total) by mouth daily. 30 capsule 5   No current facility-administered medications on file prior to visit.    No Known Allergies  Family History  Problem Relation Age of Onset  . Healthy Mother     Living  . Healthy Father     Living  . Lung cancer Maternal Grandfather   . Throat cancer Maternal Grandfather   . Cancer Maternal Uncle   . Healthy Brother     x3    Social History   Social History  . Marital Status: Single    Spouse Name: N/A  . Number of Children: N/A  . Years of Education: N/A   Social History Main Topics  . Smoking status: Former Smoker    Types: Cigarettes  . Smokeless tobacco: Never Used     Comment: Quit >5 yrs  . Alcohol Use: 0.0 oz/week    0 Standard drinks or equivalent per week     Comment: rare  . Drug Use: No  . Sexual Activity: Not Asked   Other Topics Concern  . None   Social History Narrative   Review of Systems - See HPI.  All other ROS are negative.  BP 138/70 mmHg  Pulse 66  Temp(Src) 97.7 F (36.5 C) (Oral)  Ht 6\' 2"  (1.88 m)  Wt 214 lb 9.6 oz (97.342 kg)  BMI 27.54 kg/m2  SpO2 99%  Physical Exam  Constitutional: He is oriented to person, place, and time and well-developed,  well-nourished, and in no distress.  HENT:  Head: Normocephalic and atraumatic.  Eyes: Conjunctivae are normal.  Cardiovascular: Normal rate, regular rhythm, normal heart sounds and intact distal pulses.   Musculoskeletal:       Lumbar back: He exhibits pain. He exhibits no tenderness, no bony tenderness and no spasm.  Neurological: He is alert and oriented to person, place, and time.  Skin: Skin is warm and dry. No rash noted.  Psychiatric: Affect normal.  Vitals reviewed.   No results found for this or any previous visit (from the past 2160 hour(s)).  Assessment/Plan: Midline low back pain without sciatica Chronic > 3 years but worsened over past 6 months. Will obtain repeat x-ray today to assess changes. Supportive measures reviewed. Rx Voltaren BID. Follow-up will be scheduled when patient is called with imaging results.

## 2015-06-07 NOTE — Assessment & Plan Note (Signed)
Chronic > 3 years but worsened over past 6 months. Will obtain repeat x-ray today to assess changes. Supportive measures reviewed. Rx Voltaren BID. Follow-up will be scheduled when patient is called with imaging results.

## 2015-07-21 ENCOUNTER — Encounter: Payer: Self-pay | Admitting: Physician Assistant

## 2015-07-21 ENCOUNTER — Ambulatory Visit (INDEPENDENT_AMBULATORY_CARE_PROVIDER_SITE_OTHER): Admitting: Physician Assistant

## 2015-07-21 VITALS — BP 130/64 | HR 83 | Temp 97.9°F | Ht 74.0 in | Wt 212.8 lb

## 2015-07-21 DIAGNOSIS — M545 Low back pain, unspecified: Secondary | ICD-10-CM

## 2015-07-21 DIAGNOSIS — M5489 Other dorsalgia: Secondary | ICD-10-CM

## 2015-07-21 MED ORDER — MELOXICAM 15 MG PO TABS: 15.0000 mg | ORAL_TABLET | Freq: Every day | ORAL | Status: DC

## 2015-07-21 MED ORDER — METHOCARBAMOL 500 MG PO TABS: 500.0000 mg | ORAL_TABLET | Freq: Every evening | ORAL | Status: DC | PRN

## 2015-07-21 NOTE — Assessment & Plan Note (Addendum)
Pain reproducible with range of motion of lower spine. Suspect muscular etiology. Rx meloxicam and Robaxin.  supportive measures reviewed. Referral to sports medicine placed.

## 2015-07-21 NOTE — Progress Notes (Signed)
   Patient presents to clinic today c/o continued low back pain with flexion and extension, not alleviated with diclofenac. Patient denies new or worsening symptoms. X-ray obtained at last visit and was unremarkable. Again patient denies trauma or injury. Denies radiation of symptoms into the lower extremities. Denies numbness, weakness or tingling.   Past Medical History  Diagnosis Date  . Acid reflux   . History of chicken pox   . Lyme disease     Unknown    Current Outpatient Prescriptions on File Prior to Visit  Medication Sig Dispense Refill  . Multiple Vitamin (MULTIVITAMIN) tablet Take 1 tablet by mouth daily.    Marland Kitchen omeprazole (PRILOSEC) 40 MG capsule Take 1 capsule (40 mg total) by mouth daily. 30 capsule 5   No current facility-administered medications on file prior to visit.    No Known Allergies  Family History  Problem Relation Age of Onset  . Healthy Mother     Living  . Healthy Father     Living  . Lung cancer Maternal Grandfather   . Throat cancer Maternal Grandfather   . Cancer Maternal Uncle   . Healthy Brother     x3    Social History   Social History  . Marital Status: Single    Spouse Name: N/A  . Number of Children: N/A  . Years of Education: N/A   Social History Main Topics  . Smoking status: Former Smoker    Types: Cigarettes  . Smokeless tobacco: Never Used     Comment: Quit >5 yrs  . Alcohol Use: 0.0 oz/week    0 Standard drinks or equivalent per week     Comment: rare  . Drug Use: No  . Sexual Activity: Not Asked   Other Topics Concern  . None   Social History Narrative    Review of Systems - See HPI.  All other ROS are negative.  BP 130/64 mmHg  Pulse 83  Temp(Src) 97.9 F (36.6 C) (Oral)  Ht  (1.88 m)  Wt 212 lb 12.8 oz (96.525 kg)  BMI 27.31 kg/m2  SpO2 98%  Physical Exam  Constitutional: He is oriented to person, place, and time and well-developed, well-nourished, and in no distress.  HENT:  Head: Normocephalic  and atraumatic.  Eyes: Conjunctivae are normal.  Cardiovascular: Normal rate, regular rhythm, normal heart sounds and intact distal pulses.   Pulmonary/Chest: Effort normal.  Musculoskeletal:       Lumbar back: He exhibits pain. He exhibits no bony tenderness.  Pain is lower back with flexion and extension of spine.  Neurological: He is alert and oriented to person, place, and time.  Skin: Skin is warm and dry. No rash noted.  Psychiatric: Affect normal.    No results found for this or any previous visit (from the past 2160 hour(s)).  Assessment/Plan: Back pain, lumbosacral Pain reproducible with range of motion of lower spine. Suspect muscular etiology. Rx meloxicam and Robaxin.  supportive measures reviewed. Referral to sports medicine placed.

## 2015-07-21 NOTE — Patient Instructions (Signed)
Please take the Robaxin at bedtime. Use the meloxicam once daily as directed with food. Tylenol Arthritis if needed for breakthrough pain.  You will be contacted for an assessment by sports medicine. Call and let me know if you have not heard from them by Thursday.  Again avoid heavy lifting or over exertion. Heating pad may be beneficial.

## 2015-07-21 NOTE — Progress Notes (Signed)
Pre visit review using our clinic review tool, if applicable. No additional management support is needed unless otherwise documented below in the visit note. 

## 2015-07-23 ENCOUNTER — Ambulatory Visit (INDEPENDENT_AMBULATORY_CARE_PROVIDER_SITE_OTHER): Admitting: Family Medicine

## 2015-07-23 ENCOUNTER — Encounter: Payer: Self-pay | Admitting: Family Medicine

## 2015-07-23 VITALS — BP 147/78 | HR 70 | Ht 75.0 in | Wt 208.0 lb

## 2015-07-23 DIAGNOSIS — M545 Low back pain, unspecified: Secondary | ICD-10-CM

## 2015-07-23 DIAGNOSIS — M5489 Other dorsalgia: Secondary | ICD-10-CM | POA: Diagnosis not present

## 2015-07-23 MED ORDER — PREDNISONE 10 MG PO TABS: ORAL_TABLET | ORAL | Status: DC

## 2015-07-23 NOTE — Patient Instructions (Signed)
I'm worried about you have a central disc bulge/herniation though a deep lumbar strain is a possibility. A prednisone dose pack is the best option for immediate relief and may be prescribed. Day after finishing prednisone restart the meloxicam with food for pain and inflammation. Robaxin as needed for muscle spasms (no driving on this medicine if it makes you sleepy). Stay as active as possible. Physical therapy has been shown to be helpful as well - start this if it's not too expensive. Strengthening of low back muscles, abdominal musculature are key for long term pain relief. If not improving, will consider further imaging (MRI). Follow up with me in 1 month to 6 weeks.

## 2015-07-27 NOTE — Progress Notes (Signed)
PCP and consultation requested by: Joshua Climes, PA-C  Subjective:   HPI: Patient is a 30 y.o. male here for low back pain.  Patient reports he's had low to mid back pain for about 6 months. 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 Pain worse after these. No radiation into legs. Pain is constant and dull at 3/10 but gets up to 5/10 and can be sharp. Pain worse with any core exercises and playing golf. No numbness or tingling. Radiographs remotely per his report showed some DDD but recent ones ordered by PCP were normal. Tried voltaren, meloxicam, muscle relaxant. No bowel/bladder dysfunction. No prior issues with his back.  Past Medical History  Diagnosis Date  . Acid reflux   . History of chicken pox   . Lyme disease     Unknown    Current Outpatient Prescriptions on File Prior to Visit  Medication Sig Dispense Refill  . meloxicam (MOBIC) 15 MG tablet Take 1 tablet (15 mg total) by mouth daily. 30 tablet 1  . methocarbamol (ROBAXIN) 500 MG tablet Take 1 tablet (500 mg total) by mouth at bedtime as needed for muscle spasms. 30 tablet 0  . Multiple Vitamin (MULTIVITAMIN) tablet Take 1 tablet by mouth daily.    Marland Kitchen omeprazole (PRILOSEC) 40 MG capsule Take 1 capsule (40 mg total) by mouth daily. 30 capsule 5   No current facility-administered medications on file prior to visit.    Past Surgical History  Procedure Laterality Date  . Wisdom tooth extraction    . Orif facial fracture      Left Cheek    No Known Allergies  Social History   Social History  . Marital Status: Single    Spouse Name: N/A  . Number of Children: N/A  . Years of Education: N/A   Occupational History  . Not on file.   Social History Main Topics  . Smoking status: Former Smoker    Types: Cigarettes  . Smokeless tobacco: Never Used     Comment: Quit >5 yrs  . Alcohol Use: 0.0 oz/week     0 Standard drinks or equivalent per week     Comment: rare  . Drug Use: No  . Sexual Activity: Not on file   Other Topics Concern  . Not on file   Social History Narrative    Family History  Problem Relation Age of Onset  . Healthy Mother     Living  . Healthy Father     Living  . Lung cancer Maternal Grandfather   . Throat cancer Maternal Grandfather   . Cancer Maternal Uncle   . Healthy Brother     x3    BP 147/78 mmHg  Pulse 70  Ht  (1.905 m)  Wt 208 lb (94.348 kg)  BMI 26.00 kg/m2  Review of Systems: See HPI above.    Objective:  Physical Exam:  Gen: NAD, comfortable in exam room  Back: No gross deformity, scoliosis. Minimal paraspinal lumbar TTP.  No midline or bony TTP. FROM with pain slightly worse on extension than flexion. Strength LEs 5/5 all muscle groups.   2+ MSRs in achilles, trace patellar tendons, equal bilaterally. Negative SLRs. Sensation intact to light touch bilaterally.  Bilateral hips: Negative logroll bilateral hips Negative fabers and piriformis stretches.    Assessment & Plan:  1. Low back pain - independently reviewed radiographs and these  are very reassuring.  I do not see DDD at L5-S1 as he was told with some remote radiographs.  History and exam are consistent with central disc bulge vs deep lumbar strain.  We discussed treatment is similar.  Has not had much benefit with nsaids to date and muscle relaxants.  He will try prednisone dose pack and start physical therapy, home exercises.  We will consider an MRI if he's not improving at follow-up in 1 month to 6 weeks.

## 2015-07-28 NOTE — Assessment & Plan Note (Signed)
independently reviewed radiographs and these are very reassuring.  I do not see DDD at L5-S1 as he was told with some remote radiographs.  History and exam are consistent with central disc bulge vs deep lumbar strain.  We discussed treatment is similar.  Has not had much benefit with nsaids to date and muscle relaxants.  He will try prednisone dose pack and start physical therapy, home exercises.  We will consider an MRI if he's not improving at follow-up in 1 month to 6 weeks.

## 2015-07-29 ENCOUNTER — Ambulatory Visit: Attending: Family Medicine | Admitting: Physical Therapy

## 2015-07-29 DIAGNOSIS — M545 Low back pain, unspecified: Secondary | ICD-10-CM

## 2015-07-29 DIAGNOSIS — R293 Abnormal posture: Secondary | ICD-10-CM | POA: Insufficient documentation

## 2015-07-29 NOTE — Therapy (Signed)
[facility] Rehabilitation [facility] [address]  [address] [place], [name], [address] Phone: [phone]   Fax:  [phone]  Physical Therapy Evaluation  Patient Details  Name: Joshua Fry MRN: [id number] Date of Birth: Oct 30, 1985 Referring Provider: Norton Blizzard, MD  Encounter Date: 07/29/2015      PT End of Session - 07/29/15 0826    Visit Number 1   Number of Visits 8   Date for PT Re-Evaluation 08/26/15   PT Start Time 0757   PT Stop Time 0842   PT Time Calculation (min) 45 min   Activity Tolerance Patient tolerated treatment well   Behavior During Therapy [facility] for tasks assessed/performed      Past Medical History  Diagnosis Date  . Acid reflux   . History of chicken pox   . Lyme disease     Unknown    Past Surgical History  Procedure Laterality Date  . Wisdom tooth extraction    . Orif facial fracture      Left Cheek    There were no vitals filed for this visit.  Visit Diagnosis:  Midline low back pain without sciatica - Plan: PT plan of care cert/re-cert  Abnormal posture - Plan: PT plan of care cert/re-cert      Subjective Assessment - 07/29/15 0759    Subjective Pt is a 31 y/o male who presents to OPPT with 4-5 year hx of LBP with exacerbation in past 6-12 months.  Pt reports walking/standing and core exercises increase pain.  Pt states he is unable to do dead lifts due to pain.  Pt on prednisone x 1 week with significant improvement in symptoms.   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 couple hours   How long can you stand comfortably? couple hours   How long can you walk comfortably? couple hours   Diagnostic tests xrays negative   Patient Stated Goals improve pain, be able to golf   Currently in Pain? Yes   Pain Score 2   up to 8/10 before prednisone   Pain Location Back   Pain Orientation Right;Left;Lower   Pain  Descriptors / Indicators Aching;Tightness;Sharp  "swollen"   Pain Type Chronic pain   Pain Radiating Towards none   Pain Onset More than a month ago   Pain Frequency Constant   Aggravating Factors  core exercises; standing; walking   Pain Relieving Factors lying flat, prednisone            [facility] PT Assessment - 07/29/15 0806    Assessment   Medical Diagnosis LBP   Referring Provider Norton Blizzard, MD   Onset Date/Surgical Date --  2012/2013; with 6-12 month exacerbation   Next MD Visit 4 wks   Prior Therapy none   Precautions   Precautions None   Restrictions   Weight Bearing Restrictions No   Balance Screen   Has the patient fallen in the past 6 months No   Has the patient had a decrease in activity level because of a fear of falling?  No   Is the patient reluctant to leave their home because of a fear of falling?  No   Home Environment   Additional Comments has mild difficulty with all activities including stairs   Prior Function   Level of Independence Independent   Vocation Full time employment   Teacher, early years/pre; builds houses and active duty with [employer]   Leisure  golf, working out   Observation/Other Assessments   Focus on Therapeutic Outcomes (FOTO)  52 (48% limited; predicted 34% limited)   Posture/Postural Control   Posture/Postural Control Postural limitations   Postural Limitations Decreased lumbar lordosis   AROM   Overall AROM Comments lumbar ROM WFL; increased pain with flexion, extension and pain to ipsilateral side with sidebending (extension causes most pain)   Strength   Strength Assessment Site Hip;Knee;Ankle   Right Hip Flexion 5/5   Right Hip Extension 5/5   Right Hip ABduction 5/5   Right Hip ADduction 5/5   Left Hip Flexion 5/5   Left Hip Extension 5/5   Left Hip ABduction 5/5   Left Hip ADduction 5/5   Right/Left Knee Right;Left   Right Knee Flexion 5/5   Right Knee Extension 5/5   Left Knee Flexion 5/5   Left Knee  Extension 5/5   Right Ankle Dorsiflexion 5/5   Left Ankle Dorsiflexion 5/5   Flexibility   Soft Tissue Assessment /Muscle Length yes   Hamstrings tightness bil; L>R   Piriformis tightness bil R>L   Palpation   Spinal mobility central P/A WNL; pain and tenderness along lumbar paraspinals L>R   Special Tests    Special Tests Lumbar;Sacrolliac Tests   Lumbar Tests Straight Leg Raise   Sacroiliac Tests  Pelvic Distraction   Straight Leg Raise   Findings Negative   Pelvic Dictraction   Findings Negative   Pelvic Compression   Findings Negative          Manual - Dry Needling (verbal informed consent provided prior to treatment) performed to L lower and mid l-spine paraspinals.  No twitch response noted but palpable reduction in tone noted. Following needling, applied 2 strips kinesiotape at 50% stretch to B lumbar paraspinals with pt in flexion                 PT Education - 07/29/15 0826    Education provided Yes   Education Details HEP   Person(s) Educated Patient   Methods Explanation;Demonstration;Handout   Comprehension Verbalized understanding;Returned demonstration;Need further instruction             PT Long Term Goals - 07/29/15 1610    PT LONG TERM GOAL #1   Title independent with HEP (08/26/15)   Time 4   Period Weeks   Status New   PT LONG TERM GOAL #2   Title report no pain after workout for improved function and mobility (08/26/15)   Time 4   Period Weeks   Status New   PT LONG TERM GOAL #3   Title report ability to play a round of golf without increase in pain (08/26/15)   Time 4   Period Weeks   Status New   PT LONG TERM GOAL #4   Title tolerate full work day without increase in pain (08/26/15)   Time 4   Period Weeks   Status New               Plan - 07/29/15 0827    Clinical Impression Statement Pt is a 30 y/o male who presents to OPPT with 4-5 year history of LBP; recently exacerbated over past 6 months.  Pt demonstrated  increased muscle tone in bil paraspinals L worse than R with tenderness to palpation.  Treated today with trigger point dry needling.  Pt will benefit from PT to decrease pain and return to pain free activities.   Pt will benefit from skilled therapeutic intervention  in order to improve on the following deficits Pain;Decreased activity tolerance;Increased muscle spasms;Impaired flexibility   Rehab Potential Excellent   PT Frequency 2x / week   PT Duration 4 weeks   PT Treatment/Interventions ADLs/Self Care Home Management;Cryotherapy;Electrical Stimulation;Moist Heat;Iontophoresis 4mg /ml Dexamethasone;Therapeutic exercise;Therapeutic activities;Functional mobility training;Ultrasound;Traction;Patient/family education;Manual techniques;Taping;Dry needling   PT Next Visit Plan dry needling PRN, posture/body mechanics, core strengthening (neutral spine)   Consulted and Agree with Plan of Care Patient          G-Codes - 08-08-2015 0836    Functional Assessment Tool Used FOTO 48% limited   Functional Limitation Mobility: Walking and moving around   Mobility: Walking and Moving Around Current Status [phone]) At least 40 percent but less than 60 percent impaired, limited or restricted   Mobility: Walking and Moving Around Goal Status [phone]) At least 20 percent but less than 40 percent impaired, limited or restricted       Problem List Patient Active Problem List   Diagnosis Date Noted  . Back pain, lumbosacral 07/21/2015  . Epididymal cyst 07/17/2014  . GERD (gastroesophageal reflux disease) 07/17/2014   Clarita Crane, PT, DPT August 08, 2015 8:44 AM  Juan Quam PT, OCS 08/08/2015 2:33 PM  [facility] [address]  [address] [place], [name], [address] Phone: [phone]   Fax:  [phone]  Name: BERNIS SCHREUR MRN: [id number] Date of Birth: 09-09-1985

## 2015-07-29 NOTE — Patient Instructions (Signed)
Hamstring Step 2    Left foot relaxed, knee straight, other leg bent, foot flat. Raise straight leg further upward to maximal range. Hold _20-30__ seconds. Relax leg completely down. Repeat _2-3__ times.  Copyright  VHI. All rights reserved.   Piriformis (Supine)    Cross legs, right on top. Gently pull other knee toward chest until stretch is felt in buttock/hip of top leg. Hold _20-30___ seconds. Repeat __2-3_ times per set. Do _1__ sets per session. Do _1-2___ sessions per day.  http://orth.exer.us/676   Copyright  VHI. All rights reserved.   Lower Trunk Rotation Stretch    Keeping back flat and feet together, rotate knees to left side. Hold __20-30__ seconds.  Repeat to other side. Repeat _2-3___ times per set. Do _1__ sets per session. Do __1-2__ sessions per day.  http://orth.exer.us/122   Copyright  VHI. All rights reserved.

## 2015-08-05 ENCOUNTER — Ambulatory Visit: Admitting: Physical Therapy

## 2015-08-05 DIAGNOSIS — R293 Abnormal posture: Secondary | ICD-10-CM

## 2015-08-05 DIAGNOSIS — M545 Low back pain, unspecified: Secondary | ICD-10-CM

## 2015-08-05 NOTE — Therapy (Signed)
[facility] [address]  [address] [place], [name], [address] Phone: [phone]   Fax:  [phone]  Physical Therapy Treatment  Patient Details  Name: Joshua Fry MRN: [id number] Date of Birth: 1986/04/27 Referring Provider: Norton Blizzard, MD  Encounter Date: 08/05/2015      PT End of Session - 08/05/15 0935    Visit Number 2   Number of Visits 8   Date for PT Re-Evaluation 08/26/15   PT Start Time 0933   PT Stop Time 1032   PT Time Calculation (min) 59 min      Past Medical History  Diagnosis Date  . Acid reflux   . History of chicken pox   . Lyme disease     Unknown    Past Surgical History  Procedure Laterality Date  . Wisdom tooth extraction    . Orif facial fracture      Left Cheek    There were no vitals filed for this visit.  Visit Diagnosis:  Midline low back pain without sciatica  Abnormal posture      Subjective Assessment - 08/05/15 0935    Subjective States had to stand a great deal this past weekend and noted increase in pain.  Also notes pain in lower back which tends to work its way up his back which he describes as a heavy, ache sensation "feels like sand or lead in my back".  States last dose of prednisone was last week and he feels pain has been increasing since that time.  Reports has noted n/t in posteroir L LE in the past (thigh) but not recently.   Currently in Pain? Yes   Pain Score --  1-2/10 currently; up to 5-6/10 over the weekend with prolonged standing   Pain Location Back   Pain Orientation Lower;Right;Left   Pain Descriptors / Indicators Aching;Heaviness   Aggravating Factors  standing, sitting in trunk, prolonged sitting in chair   Pain Relieving Factors when notes LBP with sitting at desk is able to decrease by kneeling and working at desk          TODAY'S TREATMENT TherEx - Bridge 15x HS Stretch (notes L foot tingling with SLR greater than 45 dg on  L) L-spine extension self-mob with towel roll - 2' POE HEP Instruct  Manual - Pt prone, UPA grade 3 mid and lower l-spine  Mecahnical Traction - l-spine, prone, neutral pull, 60"/20", 60#/30#, 16'            PT Long Term Goals - 08/05/15 1018    PT LONG TERM GOAL #1   Title independent with HEP (08/26/15)   Status On-going   PT LONG TERM GOAL #2   Title report no pain after workout for improved function and mobility (08/26/15)   Status On-going   PT LONG TERM GOAL #3   Title report ability to play a round of golf without increase in pain (08/26/15)   Status On-going   PT LONG TERM GOAL #4   Title tolerate full work day without increase in pain (08/26/15)   Status On-going               Plan - 08/05/15 1127    Clinical Impression Statement pt with POS L SLR for foot n/t at 45 degrees and c/o increased LBP lately (stating since last dose of prednisone last week).  Most pain noted with prolonged sitting and standing.  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   PT Next Visit Plan continue traction and extension approach; possible taping; other modalities PRN; posture/body mechanics training   Consulted and Agree with Plan of Care Patient        Problem List Patient Active Problem List   Diagnosis Date Noted  . Back pain, lumbosacral 07/21/2015  . Epididymal cyst 07/17/2014  . GERD (gastroesophageal reflux disease) 07/17/2014    Shantara Goosby PT, OCS 08/05/2015, 11:51 AM  [facility] [address]  [address] [place], [place], [address] Phone: [phone]   Fax:  [phone]  Name: Joshua Fry MRN: [id number] Date of Birth: 08/24/1985

## 2015-08-09 ENCOUNTER — Ambulatory Visit: Admitting: Physical Therapy

## 2015-08-09 DIAGNOSIS — M545 Low back pain, unspecified: Secondary | ICD-10-CM

## 2015-08-09 DIAGNOSIS — R293 Abnormal posture: Secondary | ICD-10-CM

## 2015-08-09 NOTE — Therapy (Signed)
[facility] [place] [facility] [address]  [address] [place], [place], [address] Phone: [phone]   Fax:  [phone]  Physical Therapy Treatment  Patient Details  Name: Joshua LipsWilliam M Suit MRN: [id number] Date of Birth: 20-Nov-1985 Referring Provider: Norton BlizzardShane Hudnall, MD  Encounter Date: 08/09/2015      PT End of Session - 08/09/15 0856    Visit Number 3   Number of Visits 8   Date for PT Re-Evaluation 08/26/15   PT Start Time 0856  pt late   PT Stop Time 0949   PT Time Calculation (min) 53 min      Past Medical History  Diagnosis Date  . Acid reflux   . History of chicken pox   . Lyme disease     Unknown    Past Surgical History  Procedure Laterality Date  . Wisdom tooth extraction    . Orif facial fracture      Left Cheek    There were no vitals filed for this visit.  Visit Diagnosis:  Midline low back pain without sciatica  Abnormal posture      Subjective Assessment - 08/09/15 0857    Subjective states has been feeling better since last treatment rating LBP 1/10 - "just uncomfortable". States is paying much more attention to posture lately.   Currently in Pain? Yes   Pain Score 1    Pain Location Back   Pain Orientation Lower           TODAY'S TREATMENT Manual - Pt prone, UPA grade 3 mid and lower l-spine  TherEx - POE 2' Prone Knee Flexion stretch Quadruped UE/LE 10x3" HS Stretch and LE Nerve Glides  Kinesiotape - 50% strip B L-spine  Mecahnical Traction - l-spine, prone, neutral pull, 60"/20", 68#/34#, 16'                 PT Long Term Goals - 08/05/15 1018    PT LONG TERM GOAL #1   Title independent with HEP (08/26/15)   Status On-going   PT LONG TERM GOAL #2   Title report no pain after workout for improved function and mobility (08/26/15)   Status On-going   PT LONG TERM GOAL #3   Title report ability to play a round of golf without increase in pain (08/26/15)   Status On-going    PT LONG TERM GOAL #4   Title tolerate full work day without increase in pain (08/26/15)   Status On-going               Plan - 08/09/15 0928    Clinical Impression Statement is progressing well with current POC; is performing HEP and monitoring posture more   PT Next Visit Plan continue traction and extension approach; taping and DN PRN; other modalities PRN; posture/body mechanics training and stabiliy progression as able   Consulted and Agree with Plan of Care Patient        Problem List Patient Active Problem List   Diagnosis Date Noted  . Back pain, lumbosacral 07/21/2015  . Epididymal cyst 07/17/2014  . GERD (gastroesophageal reflux disease) 07/17/2014    Sinaya Minogue PT, OCS 08/09/2015, 12:15 PM  [facility] [address]  [address] [place], [place], [address] Phone: [phone]   Fax:  [phone]  Name: Joshua LipsWilliam M Golladay MRN: [id number] Date of Birth: 20-Nov-1985

## 2015-08-12 ENCOUNTER — Ambulatory Visit: Admitting: Physical Therapy

## 2015-08-12 DIAGNOSIS — M545 Low back pain, unspecified: Secondary | ICD-10-CM

## 2015-08-12 DIAGNOSIS — R293 Abnormal posture: Secondary | ICD-10-CM

## 2015-08-12 NOTE — Therapy (Signed)
[facility] Outpatient Rehabilitation [facility] [address]  [address] [place], [name], [address] Phone: [phone]   Fax:  [phone]  Physical Therapy Treatment  Patient Details  Name: Joshua Fry MRN: [id number] Date of Birth: September 07, 1985 Referring Provider: Norton Blizzard, MD  Encounter Date: 08/12/2015      PT End of Session - 08/12/15 6962    Visit Number 4   Number of Visits 8   Date for PT Re-Evaluation 08/26/15   PT Start Time 0720  pt late   PT Stop Time 0818   PT Time Calculation (min) 58 min      Past Medical History  Diagnosis Date  . Acid reflux   . History of chicken pox   . Lyme disease     Unknown    Past Surgical History  Procedure Laterality Date  . Wisdom tooth extraction    . Orif facial fracture      Left Cheek    There were no vitals filed for this visit.  Visit Diagnosis:  Midline low back pain without sciatica  Abnormal posture      Subjective Assessment - 08/12/15 0722    Subjective States went to [facility] yesterday and is scheduled for l-spine MRI later this month.  He states he'll likely have to reschedule this appointment.  He states his back pain is improving quite well since beginning PT and is currently noted mostly with prolonged sitting.  States pain rates around 1/10 lately.  States has been able to exercise more lately and is focusing on posture more and more.  States exercise is more limited by ankle pain (chronic) rather than LBP lately.   Currently in Pain? Yes   Pain Score 1    Pain Location Back   Pain Orientation Lower   Aggravating Factors  prolonged sitting          TODAY'S TREATMENT Manual - Pt prone, UPA grade 3 mid and lower l-spine  TherEx - POE 2' Quadruped UE/LE 10x3" Prone Knee Flexion stretch HS Stretch and LE Nerve Glides Low Row 45# 15x, 75# 15x Single Hand Low Row 35# 15x each TRX SL Squat/Lunge 12x each  Mecahnical Traction - l-spine, prone, neutral pull, 60"/20",  76#/38#, 18'             PT Long Term Goals - 08/05/15 1018    PT LONG TERM GOAL #1   Title independent with HEP (08/26/15)   Status On-going   PT LONG TERM GOAL #2   Title report no pain after workout for improved function and mobility (08/26/15)   Status On-going   PT LONG TERM GOAL #3   Title report ability to play a round of golf without increase in pain (08/26/15)   Status On-going   PT LONG TERM GOAL #4   Title tolerate full work day without increase in pain (08/26/15)   Status On-going               Plan - 08/12/15 0752    Clinical Impression Statement continues to progress very well.  Increased variety and intensity of exercises today without issue.   PT Next Visit Plan continue traction and extension approach; taping and DN PRN; other modalities PRN; posture/body mechanics training and stabiliy progression as able   Consulted and Agree with Plan of Care Patient        Problem List Patient Active Problem List   Diagnosis Date Noted  . Back pain, lumbosacral 07/21/2015  .  Epididymal cyst 07/17/2014  . GERD (gastroesophageal reflux disease) 07/17/2014    Ashleyann Shoun PT, OCS 08/12/2015, 9:01 AM  [facility] [address]  [address] [place], [place], [address] Phone: [phone]   Fax:  [phone]  Name: Joshua Fry MRN: [id number] Date of Birth: Mar 20, 1986

## 2015-08-17 ENCOUNTER — Ambulatory Visit: Admitting: Physical Therapy

## 2015-08-17 DIAGNOSIS — M545 Low back pain, unspecified: Secondary | ICD-10-CM

## 2015-08-17 DIAGNOSIS — R293 Abnormal posture: Secondary | ICD-10-CM

## 2015-08-17 NOTE — Therapy (Signed)
[facility] [address]  [address] [place], [place], [address] Phone: [phone]   Fax:  [phone]  Physical Therapy Treatment  Patient Details  Name: Joshua Fry MRN: [id number] Date of Birth: 29-Oct-1985 Referring Provider: Norton BlizzardShane Hudnall, MD  Encounter Date: 08/17/2015      PT End of Session - 08/17/15 0808    Visit Number 5   Number of Visits 8   Date for PT Re-Evaluation 08/26/15   PT Start Time 0807   PT Stop Time 0910   PT Time Calculation (min) 63 min      Past Medical History  Diagnosis Date  . Acid reflux   . History of chicken pox   . Lyme disease     Unknown    Past Surgical History  Procedure Laterality Date  . Wisdom tooth extraction    . Orif facial fracture      Left Cheek    There were no vitals filed for this visit.  Visit Diagnosis:  Midline low back pain without sciatica  Abnormal posture      Subjective Assessment - 08/17/15 0810    Subjective States played 18 holes golf 2 days ago.  Was feeling good prior to this but significant increase in pain since that time.  Pain 5/10 yesterday, 4/10 today.  Pain localized to l-spine, denies pain or n/t into LE. Has 3 days of golf pending this coming weekend.  MRI is scheduled for August 31, 2015.   Currently in Pain? Yes   Pain Score 4    Pain Location Back   Pain Orientation Lower              TODAY'S TREATMENT Manual - Dry Needling (verbal informed consent given prior to treatment) performed to B L-spine mid to lower paraspinals (good twitch response noted on L) then several bouts of 20" Premod E-stim (10-20Hz ) performed between 3 needles on each side of l-spine.  2 strips kinesiotape - 50% to B l-spine with instruct to perform on self  Mechanical Traction - l-spine, prone, neutral pull, 60"/20", 78#/39#, 18'         PT Long Term Goals - 08/05/15 1018    PT LONG TERM GOAL #1   Title independent with HEP (08/26/15)   Status On-going   PT LONG TERM GOAL #2   Title report no pain after workout for improved function and mobility (08/26/15)   Status On-going   PT LONG TERM GOAL #3   Title report ability to play a round of golf without increase in pain (08/26/15)   Status On-going   PT LONG TERM GOAL #4   Title tolerate full work day without increase in pain (08/26/15)   Status On-going               Plan - 08/17/15 0850    Clinical Impression Statement pt with increased pain following 18 holes of golf 2 days ago.  Palpable TTP to Left l-spine paraspinals and high tone but minimal tenderness also on R.  Performed dry needling with e-stim today and good relaxation noted on Left.  Instructed in taping l-spine due to pt having 3 days of golf coming up this weekend and will not be seen by PT until after this.   PT Next Visit Plan continue traction and extension approach; taping and DN PRN; other modalities PRN; posture/body mechanics training and stabiliy progression as able   Consulted and Agree with Plan of Care  Patient        Problem List Patient Active Problem List   Diagnosis Date Noted  . Back pain, lumbosacral 07/21/2015  . Epididymal cyst 07/17/2014  . GERD (gastroesophageal reflux disease) 07/17/2014    Joshua Fry PT, OCS 08/17/2015, 9:13 AM  [facility] [address]  [address] [place], [name], [address] Phone: [phone]   Fax:  [phone]  Name: Joshua Fry MRN: [id number] Date of Birth: 06/07/1985

## 2015-08-20 ENCOUNTER — Ambulatory Visit: Admitting: Physical Therapy

## 2015-08-24 ENCOUNTER — Ambulatory Visit: Admitting: Physical Therapy

## 2015-08-25 ENCOUNTER — Ambulatory Visit: Admitting: Family Medicine

## 2015-08-27 ENCOUNTER — Ambulatory Visit: Admitting: Physical Therapy

## 2015-09-01 ENCOUNTER — Ambulatory Visit: Admitting: Family Medicine

## 2015-09-02 ENCOUNTER — Ambulatory Visit: Payer: 59 | Attending: Family Medicine | Admitting: Physical Therapy

## 2015-09-02 DIAGNOSIS — M545 Low back pain, unspecified: Secondary | ICD-10-CM

## 2015-09-02 DIAGNOSIS — R293 Abnormal posture: Secondary | ICD-10-CM

## 2015-09-02 NOTE — Therapy (Addendum)
[facility] [address]  [place], [place], [address] Phone: [phone]   Fax:  [phone]  Physical Therapy Treatment  Patient Details  Name: Joshua Fry MRN: [id number] Date of Birth: Apr 16, 1986 Referring Provider: Karlton Lemon, MD  Encounter Date: 09/02/2015      PT End of Session - 09/02/15 1457    Visit Number 6   Number of Visits 8   PT Start Time 7867   PT Stop Time 1610   PT Time Calculation (min) 74 min      Past Medical History  Diagnosis Date  . Acid reflux   . History of chicken pox   . Lyme disease     Unknown    Past Surgical History  Procedure Laterality Date  . Wisdom tooth extraction    . Orif facial fracture      Left Cheek    There were no vitals filed for this visit.  Visit Diagnosis:  Midline low back pain without sciatica  Abnormal posture      Subjective Assessment - 09/02/15 1457    Subjective states noted immediate benefit following last treatment (dry needling with e-stim) and states has been golfing and participated in 5 mile ruck.  States did note some pain with these activities but not as much as he had anticipated.  States in the past had he performed the ruck he wouldn't had been able to get out of bed the next day.  This time he states pain was "maybe a 4/10".   Diagnostic tests had an MRI at [facility] 2 days ago (08/31/15) but hasn't received results yet   Currently in Pain? Yes   Pain Score 1    Pain Location Back   Pain Orientation Lower            [facility] PT Assessment - 09/02/15 0001    Assessment   Next MD Visit 09/09/15        TODAY'S TREATMENT Lumbar re-assessment (AROM, special testing)  Stretch - B HS, Piriformis, Mod Thomas Hip flexor  Manual - STM B l-spine paraspinals. Dry Needling (verbal informed consent given prior to treatment) performed to L L-spine mid to lower paraspinals (mild twitch response noted) then several bouts of 20" Premod  E-stim (10-_0 ) performed between 4 needles.  2 strips kinesiotape - 50% to B l-spine  Mechanical Traction - l-spine, prone, neutral pull, 60"/20", 78#/39#, 16'                     PT Long Term Goals - 08/05/15 1018    PT LONG TERM GOAL #1   Title independent with HEP (08/26/15)   Status On-going   PT LONG TERM GOAL #2   Title report no pain after workout for improved function and mobility (08/26/15)   Status On-going   PT LONG TERM GOAL #3   Title report ability to play a round of golf without increase in pain (08/26/15)   Status On-going   PT LONG TERM GOAL #4   Title tolerate full work day without increase in pain (08/26/15)   Status On-going               Plan - 09/02/15 1507    Clinical Impression Statement pt has been doing very good since his last PT appointment 2 weeks ago.  Pain down to 1/10 on AVG and up to 4/10 at worst which was following a 5 mile ruck march and  weekend of field training with [employer].  He states in the past that training would have increased his pain to point of not being able to get out of bed.  He denies noting n/t or LE pain lately other than some n/t in lower back during the march.  Special testing today = NEG SLR (had been POS at 45 degrees in the past.  Lumbar AROM WFL with pt noting some LBP with end range extension.  Tenderness and high tone in L l-spine paraspinals so needling repeated in this area.   PT Next Visit Plan continue traction and extension approach; taping and DN PRN; stabiliy progression as able   Consulted and Agree with Plan of Care Patient        Problem List Patient Active Problem List   Diagnosis Date Noted  . Back pain, lumbosacral 07/21/2015  . Epididymal cyst 07/17/2014  . GERD (gastroesophageal reflux disease) 07/17/2014    Joshua Fry PT, OCS 09/02/2015, 4:28 PM  [facility] [address]  [place], [place], [address] Phone:  [phone]   Fax:  [phone]  Name: Joshua Fry MRN: [id number] Date of Birth: January 10, 1986    PHYSICAL THERAPY DISCHARGE SUMMARY  Visits from Start of Care: 6 or 8  Current functional level related to goals / functional outcomes: Joshua Fry last seen on 09/02/15 at which time he reported good benefit with last PT treatment stating was able to participate in 5 mile ruck.  Then went to MD on 09/15/15 and reported pain returned and that PT did not offer last benefit.  He is therefore being discharged from our care.   Remaining deficits: LBP  Plan: Patient agrees to discharge.  Patient goals were not met. Patient is being discharged due to not returning since the last visit.  ?????       Joshua Fry PT, OCS 10/12/2015 10:23 AM

## 2015-09-08 ENCOUNTER — Ambulatory Visit: Admitting: Family Medicine

## 2015-09-15 ENCOUNTER — Ambulatory Visit (INDEPENDENT_AMBULATORY_CARE_PROVIDER_SITE_OTHER): Payer: 59 | Admitting: Family Medicine

## 2015-09-15 VITALS — BP 145/76 | HR 60 | Ht 75.0 in | Wt 208.0 lb

## 2015-09-15 DIAGNOSIS — M545 Low back pain, unspecified: Secondary | ICD-10-CM

## 2015-09-15 DIAGNOSIS — M5489 Other dorsalgia: Secondary | ICD-10-CM | POA: Diagnosis not present

## 2015-09-15 NOTE — Patient Instructions (Signed)
Give chiropractic care a try ([facility] [phone] - Riki RuskJeremy or Clayburn PertEvan). Do some core exercises as we discussed (obliques, crunches, leg lifts, bicycles, swimmers) - start low (1 set of 10 initially) and increase every other day - take days off in between at first. If not improving we can consider epidural steroid injections. Follow up with me in 6 weeks but call me sooner if necessary.

## 2015-09-16 ENCOUNTER — Encounter: Payer: Self-pay | Admitting: Family Medicine

## 2015-09-16 NOTE — Progress Notes (Signed)
PCP and consultation requested by: Joshua Fry, Joshua Cody, PA-C  Subjective:   HPI: Patient is a 30 y.o. male here for low back pain.  2/24: Patient reports he's had low to mid back pain for about 6 months. 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 Pain worse after these. No radiation into legs. Pain is constant and dull at 3/10 but gets up to 5/10 and can be sharp. Pain worse with any core exercises and playing golf. No numbness or tingling. Radiographs remotely per his report showed some DDD but recent ones ordered by PCP were normal. Tried voltaren, meloxicam, muscle relaxant. No bowel/bladder dysfunction. No prior issues with his back.  4/19: Patient reports PT helped him but pain did not stay improved. Pain in low back, goes into both hips. No radiation down legs though. Pain is 3/10 at rest, up to 7/10 and sharp at worst. Difficulty bending especially. Dry needling was beneficial when he had this too. No bowel/bladder dysfunction. He had an MRI at the [facility] and provided us with a copy.  Past Medical History  Diagnosis Date  . Acid reflux   . History of chicken pox   . Lyme disease     Unknown    Current Outpatient Prescriptions on File Prior to Visit  Medication Sig Dispense Refill  . meloxicam (MOBIC) 15 MG tablet Take 1 tablet (15 mg total) by mouth daily. 30 tablet 1  . methocarbamol (ROBAXIN) 500 MG tablet Take 1 tablet (500 mg total) by mouth at bedtime as needed for muscle spasms. (Patient not taking: Reported on 08/09/2015) 30 tablet 0  . Multiple Vitamin (MULTIVITAMIN) tablet Take 1 tablet by mouth daily.    Marland Kitchen. omeprazole (PRILOSEC) 40 MG capsule Take 1 capsule (40 mg total) by mouth daily. 30 capsule 5  . predniSONE (DELTASONE) 10 MG tablet 6 tabs po day 1, 5 tabs po day 2, 4 tabs po day 3, 3 tabs po day 4, 2 tabs po day 5, 1 tab po day 6 (Patient not taking:  Reported on 08/09/2015) 21 tablet 0   No current facility-administered medications on file prior to visit.    Past Surgical History  Procedure Laterality Date  . Wisdom tooth extraction    . Orif facial fracture      Left Cheek    No Known Allergies  Social History   Social History  . Marital Status: Single    Spouse Name: N/A  . Number of Children: N/A  . Years of Education: N/A   Occupational History  . Not on file.   Social History Main Topics  . Smoking status: Former Smoker    Types: Cigarettes  . Smokeless tobacco: Never Used     Comment: Quit >5 yrs  . Alcohol Use: 0.0 oz/week    0 Standard drinks or equivalent per week     Comment: rare  . Drug Use: No  . Sexual Activity: Not on file   Other Topics Concern  . Not on file   Social History Narrative    Family History  Problem Relation Age of Onset  . Healthy Mother     Living  . Healthy Father     Living  . Lung cancer Maternal Grandfather   . Throat cancer Maternal Grandfather   . Cancer Maternal Uncle   . Healthy Brother     x3    BP 145/76 mmHg  Pulse 60  Ht  (1.905 m)  Wt 208 lb (94.348 kg)  BMI 26.00 kg/m2  Review of Systems: See HPI above.    Objective:  Physical Exam:  Gen: NAD, comfortable in exam room  Back: No gross deformity, scoliosis. Minimal paraspinal lumbar TTP.  No midline or bony TTP. FROM with pain slightly worse on extension than flexion. Strength LEs 5/5 all muscle groups.   2+ MSRs in achilles, trace patellar tendons, equal bilaterally. Negative SLRs. Sensation intact to light touch bilaterally.  Bilateral hips: Negative logroll bilateral hips Negative fabers and piriformis stretches.    Assessment & Plan:  1. Low back pain - independently reviewed radiographs last visit and these are reassuring.  MRI of lumbar spine showed 6mm retrolisthesis of L5 on S1, diffuse bulge at L4-5 with central disc protrusion, bilateral facet arthritis and mild right,  minimal left foraminal narrowing.  At L5-S1 there is worse, moderate bilateral foraminal stenosis with disc bulge here as well.  I suspect findings at L5-S1 are cause of his pain - both facet changes and disc bulge with foraminal narrowing bilaterally.  He did well only while doing physical therapy but hasn't noticed long term change with home exercises since then.  He also was improved on prednisone dose pack.  He will try chiropractic care, do some home core work.  Consider ESIs vs facet injections if not improving.  F/u in 6 weeks.

## 2015-09-16 NOTE — Assessment & Plan Note (Signed)
independently reviewed radiographs last visit and these are reassuring.  MRI of lumbar spine showed 6mm retrolisthesis of L5 on S1, diffuse bulge at L4-5 with central disc protrusion, bilateral facet arthritis and mild right, minimal left foraminal narrowing.  At L5-S1 there is worse, moderate bilateral foraminal stenosis with disc bulge here as well.  I suspect findings at L5-S1 are cause of his pain - both facet changes and disc bulge with foraminal narrowing bilaterally.  He did well only while doing physical therapy but hasn't noticed long term change with home exercises since then.  He also was improved on prednisone dose pack.  He will try chiropractic care, do some home core work.  Consider ESIs vs facet injections if not improving.  F/u in 6 weeks.

## 2015-09-17 ENCOUNTER — Encounter: Payer: Self-pay | Admitting: Family Medicine

## 2015-10-28 ENCOUNTER — Ambulatory Visit: Admitting: Family Medicine

## 2015-12-14 ENCOUNTER — Other Ambulatory Visit: Payer: Self-pay | Admitting: Physician Assistant

## 2015-12-14 NOTE — Telephone Encounter (Signed)
Refill sent per [facility] refill protocol/SLS  

## 2016-02-24 ENCOUNTER — Other Ambulatory Visit: Payer: Self-pay | Admitting: Physician Assistant

## 2016-02-25 NOTE — Telephone Encounter (Signed)
Called patient and informed him that he needs to schedule a follow up appointment prior to any future refills. Patient understood and said he will call back next week to schedule

## 2016-02-25 NOTE — Telephone Encounter (Signed)
Rx request to pharmacy/SLS Requested drug refills are authorized, however, the patient needs further evaluation and/or laboratory testing before further refills are given. Ask him to make an appointment for this.  Please call patient and schedule complete Physical with fasting labs [if afternoon appt, please inform pt he can have small breakfast]/SLS 09/29 Thanks.

## 2016-03-14 ENCOUNTER — Encounter: Payer: Self-pay | Admitting: Physician Assistant

## 2016-03-14 ENCOUNTER — Ambulatory Visit (INDEPENDENT_AMBULATORY_CARE_PROVIDER_SITE_OTHER): Payer: 59 | Admitting: Physician Assistant

## 2016-03-14 VITALS — BP 98/70 | HR 63 | Temp 97.9°F | Wt 203.2 lb

## 2016-03-14 DIAGNOSIS — M4807 Spinal stenosis, lumbosacral region: Secondary | ICD-10-CM

## 2016-03-14 DIAGNOSIS — M545 Low back pain, unspecified: Secondary | ICD-10-CM

## 2016-03-14 DIAGNOSIS — M9983 Other biomechanical lesions of lumbar region: Secondary | ICD-10-CM | POA: Diagnosis not present

## 2016-03-14 MED ORDER — TRAMADOL HCL 50 MG PO TABS: 50.0000 mg | ORAL_TABLET | Freq: Two times a day (BID) | ORAL | 0 refills | Status: DC | PRN

## 2016-03-14 MED ORDER — OMEPRAZOLE 20 MG PO CPDR: 20.0000 mg | DELAYED_RELEASE_CAPSULE | Freq: Every day | ORAL | 3 refills | Status: DC

## 2016-03-14 MED ORDER — CELECOXIB 200 MG PO CAPS: 200.0000 mg | ORAL_CAPSULE | Freq: Two times a day (BID) | ORAL | 0 refills | Status: DC

## 2016-03-14 NOTE — Patient Instructions (Signed)
Please take the Celebrex as directed with food. Start the new dose of the Prilosec daily.  Please let me know how this is working. I am setting you up with Neurosurgery. You should hear from them within 3 days. Let me know if you do not hear from them.

## 2016-03-14 NOTE — Progress Notes (Signed)
Pre visit review using our clinic review tool, if applicable. No additional management support is needed unless otherwise documented below in the visit note. 

## 2016-03-16 NOTE — Progress Notes (Signed)
    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 Patient has been followed by Sports Medicine previously. Has gone through physical therapy with little improvement in symptoms. Endorses pain is constant, worse with activity. Denies radiation of pain into extremities. Denies saddle anesthesia or change to bowel/bladder habits. Has taken Mobic PRN to help with pain.   Past Medical History:  Diagnosis Date  . Acid reflux   . History of chicken pox   . Lyme disease    Unknown    Current Outpatient Prescriptions on File Prior to Visit  Medication Sig Dispense Refill  . meloxicam (MOBIC) 15 MG tablet take 1 tablet by mouth once daily 30 tablet 1  . Multiple Vitamin (MULTIVITAMIN) tablet Take 1 tablet by mouth daily.     No current facility-administered medications on file prior to visit.     No Known Allergies  Family History  Problem Relation Age of Onset  . Healthy Mother     Living  . Healthy Father     Living  . Lung cancer Maternal Grandfather   . Throat cancer Maternal Grandfather   . Cancer Maternal Uncle   . Healthy Brother     x3    Social History   Social History  . Marital status: Single    Spouse name: N/A  . Number of children: N/A  . Years of education: N/A   Social History Main Topics  . Smoking status: Former Smoker    Types: Cigarettes  . Smokeless tobacco: Never Used     Comment: Quit >5 yrs  . Alcohol use 0.0 oz/week     Comment: rare  . Drug use: No  . Sexual activity: Not Asked   Other Topics Concern  . None   Social History Narrative  . None    Review of Systems - See HPI.  All other ROS are negative.  BP 98/70 (BP Location: Left Arm, Patient Position: Sitting, Cuff Size: Normal)   Pulse 63   Temp 97.9 F (36.6 C) (Oral)   Wt 203 lb 3.2 oz (92.2 kg)   SpO2 97%   BMI 25.40 kg/m   Physical Exam  Constitutional: He is  oriented to person, place, and time and well-developed, well-nourished, and in no distress.  HENT:  Head: Normocephalic and atraumatic.  Eyes: Conjunctivae are normal.  Cardiovascular: Normal rate, regular rhythm, normal heart sounds and intact distal pulses.   Pulmonary/Chest: Effort normal and breath sounds normal. No respiratory distress. He has no wheezes. He has no rales. He exhibits no tenderness.  Musculoskeletal: Normal range of motion.  Neurological: He is alert and oriented to person, place, and time. No cranial nerve deficit.  Psychiatric: Affect normal.  Vitals reviewed.   No results found for this or any previous visit (from the past 2160 hour(s)).  Assessment/Plan: 1. Back pain, lumbosacral Will start Celebrex. Tramadol for breakthrough pain. Supportive measures reviewed. Referral to Neurosurgery placed.   Piedad ClimesMartin, Jerell Cody, PA-C

## 2016-04-14 ENCOUNTER — Other Ambulatory Visit: Payer: Self-pay | Admitting: Neurosurgery

## 2016-04-14 DIAGNOSIS — M5127 Other intervertebral disc displacement, lumbosacral region: Secondary | ICD-10-CM

## 2016-05-12 ENCOUNTER — Ambulatory Visit
Admission: RE | Admit: 2016-05-12 | Discharge: 2016-05-12 | Disposition: A | Discharge status: Home / Self Care | Payer: 59 | Source: Ambulatory Visit | Attending: Neurosurgery | Admitting: Neurosurgery

## 2016-05-12 VITALS — BP 138/70 | HR 66

## 2016-05-12 DIAGNOSIS — M545 Low back pain, unspecified: Secondary | ICD-10-CM

## 2016-05-12 DIAGNOSIS — M5127 Other intervertebral disc displacement, lumbosacral region: Secondary | ICD-10-CM

## 2016-05-12 MED ORDER — MEPERIDINE HCL 100 MG/ML IJ SOLN
75.0000 mg | Freq: Once | INTRAMUSCULAR | Status: AC
  Administered 2016-05-12: 75 mg via INTRAMUSCULAR

## 2016-05-12 MED ORDER — ONDANSETRON HCL 4 MG/2ML IJ SOLN
4.0000 mg | Freq: Four times a day (QID) | INTRAMUSCULAR | Status: DC | PRN
Start: 2016-05-12 — End: 2016-05-13

## 2016-05-12 MED ORDER — ONDANSETRON HCL 4 MG/2ML IJ SOLN
4.0000 mg | Freq: Once | INTRAMUSCULAR | Status: AC
  Administered 2016-05-12: 4 mg via INTRAMUSCULAR

## 2016-05-12 MED ORDER — DIAZEPAM 5 MG PO TABS
10.0000 mg | ORAL_TABLET | Freq: Once | ORAL | Status: AC
  Administered 2016-05-12: 10 mg via ORAL

## 2016-05-12 MED ORDER — IOPAMIDOL (ISOVUE-M 200) INJECTION 41%
18.0000 mL | Freq: Once | INTRAMUSCULAR | Status: AC
  Administered 2016-05-12: 18 mL via INTRATHECAL

## 2016-05-12 NOTE — Discharge Instructions (Signed)
Myelogram Discharge Instructions  1. Go home and rest quietly for the next 24 hours.  It is important to lie flat for the next 24 hours.  Get up only to go to the restroom.  You may lie in the bed or on a couch on your back, your stomach, your left side or your right side.  You may have one pillow under your head.  You may have pillows between your knees while you are on your side or under your knees while you are on your back.  2. DO NOT drive today.  Recline the seat as far back as it will go, while still wearing your seat belt, on the way home.  3. You may get up to go to the bathroom as needed.  You may sit up for 10 minutes to eat.  You may resume your normal diet and medications unless otherwise indicated.  Drink lots of extra fluids today and tomorrow.  4. The incidence of headache, nausea, or vomiting is about 5% (one in 20 patients).  If you develop a headache, lie flat and drink plenty of fluids until the headache goes away.  Caffeinated beverages may be helpful.  If you develop severe nausea and vomiting or a headache that does not go away with flat bed rest, call [phone].  5. You may resume normal activities after your 24 hours of bed rest is over; however, do not exert yourself strongly or do any heavy lifting tomorrow. If when you get up you have a headache when standing, go back to bed and force fluids for another 24 hours.  6. Call your physician for a follow-up appointment.  The results of your myelogram will be sent directly to your physician by the following day.  7. If you have any questions or if complications develop after you arrive home, please call [phone].  Discharge instructions have been explained to the patient.  The patient, or the person responsible for the patient, fully understands these instructions.       May resume Tramadol on Dec. 16, 2107, after 9:30 am.

## 2016-05-12 NOTE — Progress Notes (Signed)
Patient states he has been off Tramadol "for weeks."  jkl

## 2016-05-15 ENCOUNTER — Telehealth: Payer: Self-pay

## 2016-05-15 ENCOUNTER — Telehealth: Payer: Self-pay | Admitting: Radiology

## 2016-05-15 NOTE — Telephone Encounter (Signed)
Patient called to report having a positional headache after his myelogram here 05/12/16.  Patient instructed to resume/repeat 24 hours of strict bedrest and to force (caffeinated, preferably) liquids.  Obtaining order for Epidural Blood Patch order from Becky/Dr. Jeral FruitBotero.  Patient states he will contact us tomorrow if he still has the headache. jkl

## 2016-07-24 ENCOUNTER — Other Ambulatory Visit: Payer: Self-pay | Admitting: Physician Assistant

## 2016-08-30 ENCOUNTER — Encounter: Payer: Self-pay | Admitting: Physician Assistant

## 2016-08-30 ENCOUNTER — Ambulatory Visit (INDEPENDENT_AMBULATORY_CARE_PROVIDER_SITE_OTHER): Payer: 59 | Admitting: Physician Assistant

## 2016-08-30 VITALS — BP 130/80 | HR 80 | Temp 98.7°F | Resp 14 | Ht 74.0 in | Wt 216.0 lb

## 2016-08-30 DIAGNOSIS — M47817 Spondylosis without myelopathy or radiculopathy, lumbosacral region: Secondary | ICD-10-CM

## 2016-08-30 DIAGNOSIS — M1288 Other specific arthropathies, not elsewhere classified, other specified site: Secondary | ICD-10-CM | POA: Diagnosis not present

## 2016-08-30 DIAGNOSIS — J208 Acute bronchitis due to other specified organisms: Secondary | ICD-10-CM | POA: Diagnosis not present

## 2016-08-30 DIAGNOSIS — B9689 Other specified bacterial agents as the cause of diseases classified elsewhere: Secondary | ICD-10-CM

## 2016-08-30 MED ORDER — AZITHROMYCIN 250 MG PO TABS
ORAL_TABLET | ORAL | 0 refills | Status: DC
Start: 2016-08-30 — End: 2016-10-10

## 2016-08-30 MED ORDER — CELECOXIB 100 MG PO CAPS
100.0000 mg | ORAL_CAPSULE | Freq: Two times a day (BID) | ORAL | 0 refills | Status: DC
Start: 2016-08-30 — End: 2016-10-10

## 2016-08-30 NOTE — Patient Instructions (Signed)
Take antibiotic (Azithromycin) as directed.  Increase fluids.  Get plenty of rest. Use Mucinex-DM for congestion and cough. Take a daily probiotic (I recommend Align or Culturelle, but even Activia Yogurt may be beneficial).  A humidifier placed in the bedroom may offer some relief for a dry, scratchy throat of nasal irritation.  Read information below on acute bronchitis. Please call or return to clinic if symptoms are not improving.  Restart Celebrex once feeling better. Follow-up in 3-4 weeks.  Please schedule a follow-up with Neurosurgery.  Acute Bronchitis Bronchitis is when the airways that extend from the windpipe into the lungs get red, puffy, and painful (inflamed). Bronchitis often causes thick spit (mucus) to develop. This leads to a cough. A cough is the most common symptom of bronchitis. In acute bronchitis, the condition usually begins suddenly and goes away over time (usually in 2 weeks). Smoking, allergies, and asthma can make bronchitis worse. Repeated episodes of bronchitis may cause more lung problems.  HOME CARE  Rest.  Drink enough fluids to keep your pee (urine) clear or pale yellow (unless you need to limit fluids as told by your doctor).  Only take over-the-counter or prescription medicines as told by your doctor.  Avoid smoking and secondhand smoke. These can make bronchitis worse. If you are a smoker, think about using nicotine gum or skin patches. Quitting smoking will help your lungs heal faster.  Reduce the chance of getting bronchitis again by:  Washing your hands often.  Avoiding people with cold symptoms.  Trying not to touch your hands to your mouth, nose, or eyes.  Follow up with your doctor as told.  GET HELP IF: Your symptoms do not improve after 1 week of treatment. Symptoms include:  Cough.  Fever.  Coughing up thick spit.  Body aches.  Chest congestion.  Chills.  Shortness of breath.  Sore throat.  GET HELP RIGHT AWAY IF:   You  have an increased fever.  You have chills.  You have severe shortness of breath.  You have bloody thick spit (sputum).  You throw up (vomit) often.  You lose too much body fluid (dehydration).  You have a severe headache.  You faint.  MAKE SURE YOU:   Understand these instructions.  Will watch your condition.  Will get help right away if you are not doing well or get worse. Document Released: 11/01/2007 Document Revised: 01/15/2013 Document Reviewed: 11/05/2012 [facility] Patient Information 2015 [place], [place]. This information is not intended to replace advice given to you by your health care provider. Make sure you discuss any questions you have with your health care provider.

## 2016-08-30 NOTE — Progress Notes (Signed)
Pre visit review using our clinic review tool, if applicable. No additional management support is needed unless otherwise documented below in the visit note. 

## 2016-08-31 NOTE — Progress Notes (Signed)
Patient presents to clinic today c/o 1 week of URI symptoms, starting out initially as rhinorrhea, sinus pressure and dry cough. Gradually worsened until last night when symptoms acutely worsened last night. Now with aches, headache and cough that is now productive. Notes maxillary sinus pain bilaterally and ear pressure. Noted chest tenderness when coughing. Patient with history of allergies. Denies fever, SOB or chest tightness.   Patient also with history of significant lumbar facet arthropathy, followed by specialty. Previously on Celebrex with some improvement. Would like to restart. Has follow-up with specialist to discuss injections.  Past Medical History:  Diagnosis Date  . Acid reflux   . History of chicken pox   . Lyme disease    Unknown    Current Outpatient Prescriptions on File Prior to Visit  Medication Sig Dispense Refill  . Multiple Vitamin (MULTIVITAMIN) tablet Take 1 tablet by mouth daily.    Marland Kitchen omeprazole (PRILOSEC) 20 MG capsule Take 1 capsule (20 mg total) by mouth daily. 30 capsule 3   No current facility-administered medications on file prior to visit.     No Known Allergies  Family History  Problem Relation Age of Onset  . Healthy Mother     Living  . Healthy Father     Living  . Lung cancer Maternal Grandfather   . Throat cancer Maternal Grandfather   . Cancer Maternal Uncle   . Healthy Brother     x3    Social History   Social History  . Marital status: Single    Spouse name: N/A  . Number of children: N/A  . Years of education: N/A   Social History Main Topics  . Smoking status: Former Smoker    Types: Cigarettes  . Smokeless tobacco: Never Used     Comment: Quit >5 yrs  . Alcohol use 0.0 oz/week     Comment: rare  . Drug use: No  . Sexual activity: Not Asked   Other Topics Concern  . None   Social History Narrative  . None   Review of Systems - See HPI.  All other ROS are negative.  BP 130/80   Pulse 80   Temp 98.7 F (37.1  C) (Oral)   Resp 14   Ht  (1.88 m)   Wt 216 lb (98 kg)   SpO2 96%   BMI 27.73 kg/m   Physical Exam  Physical Exam  Constitutional: He is oriented to person, place, and time and well-developed, well-nourished, and in no distress.  HENT:  Head: Normocephalic and atraumatic.  Right Ear: External ear normal.  Left Ear: External ear normal.  Nose: Nose normal.  Mouth/Throat: Oropharynx is clear and moist. No oropharyngeal exudate.  TM within normal limits bilaterally.  Eyes: Conjunctivae are normal.  Neck: Neck supple.  Cardiovascular: Normal rate, regular rhythm, normal heart sounds and intact distal pulses.   Pulmonary/Chest: Effort normal and breath sounds normal. No respiratory distress. He has no wheezes. He has no rales. He exhibits no tenderness.  Neurological: He is alert and oriented to person, place, and time.  Skin: Skin is warm and dry. No rash noted.  Psychiatric: Affect normal.  Vitals reviewed.  Assessment/Plan: 1. Acute bacterial bronchitis Rx Z-pack. Supportive measures and OTC medications reviewed. Follow-up if not improving. - azithromycin (ZITHROMAX) 250 MG tablet; Take 2 tablets on Day 1. Then take 1 tablet daily.  Dispense: 6 tablet; Refill: 0  2. Facet arthropathy, lumbosacral Restart Celebrex. Follow-up 1 month. Patient to follow-up with  Neurosurgery as scheduled. - celecoxib (CELEBREX) 100 MG capsule; Take 1 capsule (100 mg total) by mouth 2 (two) times daily.  Dispense: 60 capsule; Refill: 0   Piedad Climes, [place]

## 2016-10-10 ENCOUNTER — Other Ambulatory Visit: Payer: Self-pay | Admitting: Physician Assistant

## 2016-10-10 ENCOUNTER — Encounter: Payer: Self-pay | Admitting: Physician Assistant

## 2016-10-10 ENCOUNTER — Ambulatory Visit (INDEPENDENT_AMBULATORY_CARE_PROVIDER_SITE_OTHER): Payer: 59 | Admitting: Physician Assistant

## 2016-10-10 VITALS — BP 118/70 | HR 76 | Temp 97.7°F | Resp 14 | Ht 74.0 in | Wt 212.0 lb

## 2016-10-10 DIAGNOSIS — J302 Other seasonal allergic rhinitis: Secondary | ICD-10-CM

## 2016-10-10 DIAGNOSIS — M545 Low back pain, unspecified: Secondary | ICD-10-CM

## 2016-10-10 MED ORDER — AZELASTINE HCL 0.05 % OP SOLN: 1.0000 [drp] | Freq: Two times a day (BID) | OPHTHALMIC | 0 refills | Status: DC

## 2016-10-10 MED ORDER — LEVOCETIRIZINE DIHYDROCHLORIDE 5 MG PO TABS: 5.0000 mg | ORAL_TABLET | Freq: Every evening | ORAL | 1 refills | Status: DC

## 2016-10-10 MED ORDER — TRAMADOL HCL (ER BIPHASIC) 100 MG PO TB24: 1.0000 | ORAL_TABLET | Freq: Every day | ORAL | 0 refills | Status: DC

## 2016-10-10 NOTE — Progress Notes (Signed)
Pre visit review using our clinic review tool, if applicable. No additional management support is needed unless otherwise documented below in the visit note. 

## 2016-10-10 NOTE — Assessment & Plan Note (Signed)
Stop Celebrex. Will check CMP today. Start Tramadol ER 100 mg for 1 week trial. Patient to establish with another Neurosurgeon at current practice. CSC signed. UDS at follow-up in 1 week.

## 2016-10-10 NOTE — Assessment & Plan Note (Signed)
Rx Xyzal and Optivar. Continue Flonse. Supportive measures reviewed.  Referral to allergy placed.

## 2016-10-10 NOTE — Progress Notes (Signed)
Patient presents to clinic today to discuss allergy shots. Patient with significant history of environmental allergies exacerbated in certain seasons, namely spring. Is currently noting constant rhinorrhea, sneezing, nasal congestion and itchy/watery eyes. Currently taking loratadine and flonase with only minimal relief. Was on allergy shots as a child due to significant allergies. Would like referral to Allergy to discuss allergy shots. Denies history of asthma.   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 Was [facility], Dr. Jeral FruitBotero. Has retired since last visit. Patient was instructed to continue exercise and Celebrex that we have started.  Has taken as directed but does not note improvement with medication. Recently had his disability examination with the [employer]. Provider recommended an extended release Tramadol to see if this would help better with symptoms. Awaiting results/next steps with the specialist.   Past Medical History:  Diagnosis Date  . Acid reflux   . History of chicken pox   . Lyme disease    Unknown    Current Outpatient Prescriptions on File Prior to Visit  Medication Sig Dispense Refill  . Multiple Vitamin (MULTIVITAMIN) tablet Take 1 tablet by mouth daily.    Marland Kitchen. omeprazole (PRILOSEC) 20 MG capsule Take 1 capsule (20 mg total) by mouth daily. 30 capsule 3   No current facility-administered medications on file prior to visit.    No Known Allergies  Family History  Problem Relation Age of Onset  . Healthy Mother        Living  . Healthy Father        Living  . Lung cancer Maternal Grandfather   . Throat cancer Maternal Grandfather   . Cancer Maternal Uncle   . Healthy Brother        x3    Social History   Social History  . Marital status: Single    Spouse name: N/A  . Number of children: N/A  . Years of education: N/A    Social History Main Topics  . Smoking status: Former Smoker    Types: Cigarettes  . Smokeless tobacco: Never Used     Comment: Quit >5 yrs  . Alcohol use 0.0 oz/week     Comment: rare  . Drug use: No  . Sexual activity: Not Asked   Other Topics Concern  . None   Social History Narrative  . None   Review of Systems - See HPI.  All other ROS are negative.  BP 118/70   Pulse 76   Temp 97.7 F (36.5 C) (Oral)   Resp 14   Ht 6\' 2"  (1.88 m)   Wt 212 lb (96.2 kg)   SpO2 98%   BMI 27.22 kg/m   Physical Exam  Constitutional: He is oriented to person, place, and time and well-developed, well-nourished, and in no distress.  HENT:  Head: Normocephalic and atraumatic.  Right Ear: Tympanic membrane and external ear normal.  Left Ear: Tympanic membrane and external ear normal.  Nose: Rhinorrhea present. No mucosal edema. Right sinus exhibits no maxillary sinus tenderness and no frontal sinus tenderness. Left sinus exhibits no maxillary sinus tenderness and no frontal sinus tenderness.  Mouth/Throat: Oropharynx is clear and moist.  Neck: Neck supple.  Cardiovascular: Normal rate, regular rhythm, normal heart sounds and intact distal pulses.   Pulmonary/Chest: Effort normal and breath sounds normal. No respiratory distress. He has no wheezes. He has no rales. He exhibits no tenderness.  Lymphadenopathy:    He has no cervical adenopathy.  Neurological: He is alert and oriented to person, place, and time.  Skin: Skin is warm and dry. No rash noted.  Psychiatric: Affect normal.  Vitals reviewed.  Assessment/Plan: Seasonal allergic rhinitis Rx Xyzal and Optivar. Continue Flonse. Supportive measures reviewed.  Referral to allergy placed.  Back pain, lumbosacral Stop Celebrex. Will check CMP today. Start Tramadol ER 100 mg for 1 week trial. Patient to establish with another Neurosurgeon at current practice. CSC signed. UDS at follow-up in 1 week.    Piedad Climes, PA-C

## 2016-10-10 NOTE — Patient Instructions (Signed)
Please go to the lab. I will call you with your results.  Please start the Tramadol ER as directed. Stop the Celebrex. We will follow-up in 1 week.  For allergies, I will refer you to the Allergist. Stop the Loratadine. Start the Xyzal as directed and Azelastine drips (prescriptions) as directed. Continue the Flonase nasal spray.

## 2016-10-11 LAB — COMPREHENSIVE METABOLIC PANEL
ALK PHOS: 60 U/L (ref 39–117)
ALT: 21 U/L (ref 0–53)
AST: 18 U/L (ref 0–37)
Albumin: 4.7 g/dL (ref 3.5–5.2)
BILIRUBIN TOTAL: 0.6 mg/dL (ref 0.2–1.2)
BUN: 13 mg/dL (ref 6–23)
CALCIUM: 9.5 mg/dL (ref 8.4–10.5)
CO2: 27 mEq/L (ref 19–32)
Chloride: 106 mEq/L (ref 96–112)
Creatinine, Ser: 0.94 mg/dL (ref 0.40–1.50)
GFR: 100.73 mL/min (ref >60.00)
Glucose, Bld: 99 mg/dL (ref 70–99)
Potassium: 3.9 mEq/L (ref 3.5–5.1)
Sodium: 139 mEq/L (ref 135–145)
TOTAL PROTEIN: 6.8 g/dL (ref 6.0–8.3)

## 2016-10-17 ENCOUNTER — Ambulatory Visit (INDEPENDENT_AMBULATORY_CARE_PROVIDER_SITE_OTHER): Payer: 59 | Admitting: Physician Assistant

## 2016-10-17 ENCOUNTER — Encounter: Payer: Self-pay | Admitting: Physician Assistant

## 2016-10-17 DIAGNOSIS — M545 Low back pain, unspecified: Secondary | ICD-10-CM

## 2016-10-17 MED ORDER — TRAMADOL HCL (ER BIPHASIC) 100 MG PO TB24: 1.0000 | ORAL_TABLET | Freq: Every day | ORAL | 0 refills | Status: DC

## 2016-10-17 NOTE — Assessment & Plan Note (Signed)
UDS sample given today. Patient notes good relief of symptoms with medication. Is only taking on days where he has to be very active. Will continue same. Follow-up 4 weeks.  Indication for chronic opioid: sacroiliac arthropathy, lumbar facet arthropathy, bulging discs.  Medication and dose: Tramadol ER 100 mg # pills per month: 30 Last UDS date: 10/17/16 Pain contract signed (Y/N): Y Date narcotic database last reviewed (include red flags): 10/17/16. No red flags.

## 2016-10-17 NOTE — Progress Notes (Signed)
Patient presents to clinic today for follow-up of chronic low back pain secondary to sacroiliac arthropathy, lumbar face arthropathy and bulging discs. Is followed by neurosurgery. At last visit we started trial of Tramadol ER as he was not getting sufficient relief with Celebrex and other options. Endorses taking medication no more than as directed. Tolerating well with moderate amount of pain relief. Is happy with improvement.  Is scheduled for assessment for injections  Indication for chronic opioid: As noted above in HPI Medication and dose: Tramadol ER 100 mg # pills per month: 30 Last UDS date: 10/17/16 Pain contract signed (Y/N): Y Date narcotic database last reviewed (include red flags): 10/17/16. No red flags.  Past Medical History:  Diagnosis Date  . Acid reflux   . History of chicken pox   . Lyme disease    Unknown    Current Outpatient Prescriptions on File Prior to Visit  Medication Sig Dispense Refill  . azelastine (OPTIVAR) 0.05 % ophthalmic solution Place 1 drop into both eyes 2 (two) times daily. 6 mL 0  . levocetirizine (XYZAL) 5 MG tablet Take 1 tablet (5 mg total) by mouth every evening. 30 tablet 1  . Multiple Vitamin (MULTIVITAMIN) tablet Take 1 tablet by mouth daily.    Marland Kitchen omeprazole (PRILOSEC) 20 MG capsule take 1 capsule by mouth once daily 30 capsule 3   No current facility-administered medications on file prior to visit.     No Known Allergies  Family History  Problem Relation Age of Onset  . Healthy Mother        Living  . Healthy Father        Living  . Lung cancer Maternal Grandfather   . Throat cancer Maternal Grandfather   . Cancer Maternal Uncle   . Healthy Brother        x3    Social History   Social History  . Marital status: Single    Spouse name: N/A  . Number of children: N/A  . Years of education: N/A   Social History Main Topics  . Smoking status: Former Smoker    Types: Cigarettes  . Smokeless tobacco: Never Used   Comment: Quit >5 yrs  . Alcohol use 0.0 oz/week     Comment: rare  . Drug use: No  . Sexual activity: Not Asked   Other Topics Concern  . None   Social History Narrative  . None   Review of Systems - See HPI.  All other ROS are negative.  BP 120/72   Pulse 67   Temp 97.7 F (36.5 C) (Oral)   Resp 14   Ht _0  (1.88 m)   Wt 210 lb (95.3 kg)   SpO2 98%   BMI 26.96 kg/m   Physical Exam  Constitutional: He is oriented to person, place, and time and well-developed, well-nourished, and in no distress.  HENT:  Head: Normocephalic and atraumatic.  Eyes: Conjunctivae are normal.  Neck: Neck supple.  Cardiovascular: Normal rate, regular rhythm, normal heart sounds and intact distal pulses.   Pulmonary/Chest: Effort normal and breath sounds normal. No respiratory distress. He has no wheezes. He has no rales. He exhibits no tenderness.  Neurological: He is alert and oriented to person, place, and time.  Skin: Skin is warm and dry. No rash noted.  Psychiatric: Affect normal.  Vitals reviewed.  Recent Results (from the past 2160 hour(s))  Comp Met (CMET)     Status: None   Collection Time: 10/10/16  4:24  PM  Result Value Ref Range   Sodium 139 135 - 145 mEq/L   Potassium 3.9 3.5 - 5.1 mEq/L   Chloride 106 96 - 112 mEq/L   CO2 27 19 - 32 mEq/L   Glucose, Bld 99 70 - 99 mg/dL   BUN 13 6 - 23 mg/dL   Creatinine, Ser 0.94 0.40 - 1.50 mg/dL   Total Bilirubin 0.6 0.2 - 1.2 mg/dL   Alkaline Phosphatase 60 39 - 117 U/L   AST 18 0 - 37 U/L   ALT 21 0 - 53 U/L   Total Protein 6.8 6.0 - 8.3 g/dL   Albumin 4.7 3.5 - 5.2 g/dL   Calcium 9.5 8.4 - 10.5 mg/dL   GFR 100.73 >60.00 mL/min    Assessment/Plan: Back pain, lumbosacral UDS sample given today. Patient notes good relief of symptoms with medication. Is only taking on days where he has to be very active. Will continue same. Follow-up 4 weeks.  Indication for chronic opioid: sacroiliac arthropathy, lumbar facet arthropathy,  bulging discs.  Medication and dose: Tramadol ER 100 mg # pills per month: 30 Last UDS date: 10/17/16 Pain contract signed (Y/N): Y Date narcotic database last reviewed (include red flags): 10/17/16. No red flags.     Leeanne Rio, PA-C

## 2016-10-17 NOTE — Progress Notes (Signed)
Pre visit review using our clinic review tool, if applicable. No additional management support is needed unless otherwise documented below in the visit note. 

## 2016-10-17 NOTE — Patient Instructions (Signed)
I am glad you are doing better.  Try taking 1/2 tablet daily to see if this offers same amount of pain relief. If so, stick with this, reserving a whole tablet for bad days. Remember, on days when you will not be doing much physical activity, stick with an Aleve or Tylenol Arthritis instead.  We will plan on follow-up in 1 month.  Return sooner if needed.

## 2016-10-19 DIAGNOSIS — Z79891 Long term (current) use of opiate analgesic: Secondary | ICD-10-CM | POA: Diagnosis not present

## 2016-11-14 ENCOUNTER — Encounter: Payer: Self-pay | Admitting: Physician Assistant

## 2016-11-14 ENCOUNTER — Ambulatory Visit (INDEPENDENT_AMBULATORY_CARE_PROVIDER_SITE_OTHER): Payer: 59 | Admitting: Physician Assistant

## 2016-11-14 VITALS — BP 122/80 | HR 68 | Temp 97.8°F | Resp 14 | Ht 74.0 in | Wt 208.0 lb

## 2016-11-14 DIAGNOSIS — M545 Low back pain, unspecified: Secondary | ICD-10-CM

## 2016-11-14 MED ORDER — TRAMADOL HCL ER 100 MG PO TB24: ORAL_TABLET | ORAL | 0 refills | Status: DC

## 2016-11-14 NOTE — Patient Instructions (Signed)
I am glad you are doing well. I have provided you with a refill of the Tramadol ER.   Please cut back to taking only on days that you are extremely physically active. You can even try cutting back to 50 mg (1/2 tablet) once daily.   Follow-up in 3 months.

## 2016-11-14 NOTE — Progress Notes (Signed)
Pre visit review using our clinic review tool, if applicable. No additional management support is needed unless otherwise documented below in the visit note. 

## 2016-11-14 NOTE — Progress Notes (Signed)
Patient presents to clinic today for 1 month follow-up regarding chronic lumbosacral back pain. At last visit patient was switched to Tramadol ER for as needed use. Was taking daily during the weeks when he had to be very physical active. States the medication helped significantly in lowering pain so that he could go about daily activities. Is wanting to   Past Medical History:  Diagnosis Date  . Acid reflux   . History of chicken pox   . Lyme disease    Unknown    Current Outpatient Prescriptions on File Prior to Visit  Medication Sig Dispense Refill  . azelastine (OPTIVAR) 0.05 % ophthalmic solution Place 1 drop into both eyes 2 (two) times daily. 6 mL 0  . levocetirizine (XYZAL) 5 MG tablet Take 1 tablet (5 mg total) by mouth every evening. 30 tablet 1  . Multiple Vitamin (MULTIVITAMIN) tablet Take 1 tablet by mouth daily.    Marland Kitchen omeprazole (PRILOSEC) 20 MG capsule take 1 capsule by mouth once daily 30 capsule 3   No current facility-administered medications on file prior to visit.     No Known Allergies  Family History  Problem Relation Age of Onset  . Healthy Mother        Living  . Healthy Father        Living  . Lung cancer Maternal Grandfather   . Throat cancer Maternal Grandfather   . Cancer Maternal Uncle   . Healthy Brother        x3    Social History   Social History  . Marital status: Single    Spouse name: N/A  . Number of children: N/A  . Years of education: N/A   Social History Main Topics  . Smoking status: Former Smoker    Types: Cigarettes  . Smokeless tobacco: Never Used     Comment: Quit >5 yrs  . Alcohol use 0.0 oz/week     Comment: rare  . Drug use: No  . Sexual activity: Not Asked   Other Topics Concern  . None   Social History Narrative  . None   Review of Systems - See HPI.  All other ROS are negative.  BP 122/80   Pulse 68   Temp 97.8 F (36.6 C) (Oral)   Resp 14   Ht _0  (1.88 m)   Wt 208 lb (94.3 kg)   SpO2 99%    BMI 26.71 kg/m   Physical Exam  Constitutional: He is oriented to person, place, and time and well-developed, well-nourished, and in no distress.  HENT:  Head: Normocephalic and atraumatic.  Eyes: Conjunctivae are normal.  Neck: Neck supple.  Cardiovascular: Normal rate, regular rhythm, normal heart sounds and intact distal pulses.   Pulmonary/Chest: Effort normal and breath sounds normal. No respiratory distress. He has no wheezes. He has no rales. He exhibits no tenderness.  Lymphadenopathy:    He has no cervical adenopathy.  Neurological: He is alert and oriented to person, place, and time.  Skin: Skin is warm and dry. No rash noted.  Psychiatric: Affect normal.  Vitals reviewed.   Recent Results (from the past 2160 hour(s))  Comp Met (CMET)     Status: None   Collection Time: 10/10/16  4:24 PM  Result Value Ref Range   Sodium 139 135 - 145 mEq/L   Potassium 3.9 3.5 - 5.1 mEq/L   Chloride 106 96 - 112 mEq/L   CO2 27 19 - 32 mEq/L   Glucose, Bld  99 70 - 99 mg/dL   BUN 13 6 - 23 mg/dL   Creatinine, Ser 0.94 0.40 - 1.50 mg/dL   Total Bilirubin 0.6 0.2 - 1.2 mg/dL   Alkaline Phosphatase 60 39 - 117 U/L   AST 18 0 - 37 U/L   ALT 21 0 - 53 U/L   Total Protein 6.8 6.0 - 8.3 g/dL   Albumin 4.7 3.5 - 5.2 g/dL   Calcium 9.5 8.4 - 10.5 mg/dL   GFR 100.73 >60.00 mL/min    Assessment/Plan: Back pain, lumbosacral Doing very well. We are weaning down to PRN use now. Follow-up discussed. CSC on file. UDS up-to-date.    Leeanne Rio, PA-C

## 2016-11-14 NOTE — Assessment & Plan Note (Signed)
Doing very well. We are weaning down to PRN use now. Follow-up discussed. CSC on file. UDS up-to-date.

## 2016-11-21 DIAGNOSIS — Z0189 Encounter for other specified special examinations: Secondary | ICD-10-CM | POA: Diagnosis not present

## 2016-11-21 DIAGNOSIS — R001 Bradycardia, unspecified: Secondary | ICD-10-CM | POA: Diagnosis not present

## 2016-12-05 NOTE — Progress Notes (Signed)
Patient presents to clinic today c/o rash in groin x a few weeks. Endorses rash is pruritic and [place] [employer] [place]. Denies trauma or injury to the area. Denies penile rash, swelling, or change in urination. Denies fever, chills. Has tried OTC Aquaphor and Neosporin with no change in symptoms.   Past Medical History:  Diagnosis Date  . Acid reflux   . History of chicken pox   . Lyme disease    Unknown    Current Outpatient Prescriptions on File Prior to Visit  Medication Sig Dispense Refill  . azelastine (OPTIVAR) 0.05 % ophthalmic solution Place 1 drop into both eyes 2 (two) times daily. 6 mL 0  . levocetirizine (XYZAL) 5 MG tablet Take 1 tablet (5 mg total) by mouth every evening. 30 tablet 1  . Multiple Vitamin (MULTIVITAMIN) tablet Take 1 tablet by mouth daily.    Marland Kitchen omeprazole (PRILOSEC) 20 MG capsule take 1 capsule by mouth once daily 30 capsule 3  . traMADol (ULTRAM-ER) 100 MG 24 hr tablet TK 1 T PO QD 30 tablet 0   No current facility-administered medications on file prior to visit.     No Known Allergies  Family History  Problem Relation Age of Onset  . Healthy Mother        Living  . Healthy Father        Living  . Lung cancer Maternal Grandfather   . Throat cancer Maternal Grandfather   . Cancer Maternal Uncle   . Healthy Brother        x3    Social History   Social History  . Marital status: Single    Spouse name: N/A  . Number of children: N/A  . Years of education: N/A   Social History Main Topics  . Smoking status: Former Smoker    Types: Cigarettes  . Smokeless tobacco: Never Used     Comment: Quit >5 yrs  . Alcohol use 0.0 oz/week     Comment: rare  . Drug use: No  . Sexual activity: Not Asked   Other Topics Concern  . None   Social History Narrative  . None   Review of Systems - See HPI.  All other ROS are negative.  BP 110/76   Pulse 61   Temp 97.9 F (36.6 C) (Oral)    Resp 14   Ht '6\' 2"'  (1.88 m)   Wt 204 lb (92.5 kg)   SpO2 99%   BMI 26.19 kg/m   Physical Exam  Constitutional: He is oriented to person, place, and time and well-developed, well-nourished, and in no distress.  HENT:  Head: Normocephalic and atraumatic.  Eyes: Conjunctivae are normal.  Cardiovascular: Normal rate, regular rhythm, normal heart sounds and intact distal pulses.   Pulmonary/Chest: Effort normal and breath sounds normal. No respiratory distress. He has no wheezes. He has no rales. He exhibits no tenderness.  Neurological: He is alert and oriented to person, place, and time.  Skin: Skin is warm and dry.     Psychiatric: Affect normal.  Vitals reviewed.   Recent Results (from the past 2160 hour(s))  Comp Met (CMET)     Status: None   Collection Time: 10/10/16  4:24 PM  Result Value Ref Range   Sodium 139 135 - 145 mEq/L   Potassium 3.9 3.5 - 5.1 mEq/L   Chloride 106 96 - 112 mEq/L   CO2 27 19 - 32 mEq/L  Glucose, Bld 99 70 - 99 mg/dL   BUN 13 6 - 23 mg/dL   Creatinine, Ser 0.94 0.40 - 1.50 mg/dL   Total Bilirubin 0.6 0.2 - 1.2 mg/dL   Alkaline Phosphatase 60 39 - 117 U/L   AST 18 0 - 37 U/L   ALT 21 0 - 53 U/L   Total Protein 6.8 6.0 - 8.3 g/dL   Albumin 4.7 3.5 - 5.2 g/dL   Calcium 9.5 8.4 - 10.5 mg/dL   GFR 100.73 >60.00 mL/min    Assessment/Plan: 1. Tinea cruris Rx Lamisil cream. Supportive measures and OTC medications reviewed. If not improving, will add on oral diflucan.    Leeanne Rio, PA-C

## 2016-12-06 ENCOUNTER — Encounter: Payer: Self-pay | Admitting: Physician Assistant

## 2016-12-06 ENCOUNTER — Ambulatory Visit (INDEPENDENT_AMBULATORY_CARE_PROVIDER_SITE_OTHER): Payer: 59 | Admitting: Physician Assistant

## 2016-12-06 VITALS — BP 110/76 | HR 61 | Temp 97.9°F | Resp 14 | Ht 74.0 in | Wt 204.0 lb

## 2016-12-06 DIAGNOSIS — B356 Tinea cruris: Secondary | ICD-10-CM | POA: Diagnosis not present

## 2016-12-06 MED ORDER — TERBINAFINE HCL 1 % EX CREA: 1.0000 "application " | TOPICAL_CREAM | Freq: Two times a day (BID) | CUTANEOUS | 0 refills | Status: DC

## 2016-12-06 NOTE — Progress Notes (Signed)
Pre visit review using our clinic review tool, if applicable. No additional management support is needed unless otherwise documented below in the visit note. 

## 2016-12-06 NOTE — Patient Instructions (Signed)
Please apply the Lamisil cream to the area twice daily. Keep skin clean and dry. Try to wear breathable undergarments (cotton) that are looser fitting around the thighs, etc. You may get benefit from applying powder in the area once this is resolved to prevent chaffing/rash in the future.    Jock Itch Jock itch is an infection of the skin in the groin area. It is caused by a type of germ (fungus). The infection causes a rash and itching in the groin and upper thigh. It is common in people who play sports. Being in places with hot weather and wearing tight or wet clothes can increase the chance of getting jock itch. The rash usually goes away in 2-3 weeks with treatment. Follow these instructions at home:  Take medicines only as told by your doctor. Apply skin creams or ointments exactly as told.  Wear loose-fitting clothing. ? Men should wear cotton boxer shorts. ? Women should wear cotton underwear.  Change your underwear every day to keep your groin dry.  Avoid hot baths.  Dry your groin area well after you take a bath or shower. ? Use a separate towel to dry your groin area. This will help to prevent a spreading of the infection to other areas of your body.  Do not scratch the affected area.  Do not share towels with other people. Contact a doctor if:  Your rash does not improve or it gets worse after 2 weeks of treatment.  Your rash is spreading.  Your rash comes back after treatment is finished.  You have a fever.  You have redness, swelling, or pain in the area around your rash.  You have fluid, blood, or pus coming from your rash.  Your have your rash for more than 4 weeks. This information is not intended to replace advice given to you by your health care provider. Make sure you discuss any questions you have with your health care provider. Document Released: 08/09/2009 Document Revised: 10/21/2015 Document Reviewed: 02/24/2014 Elsevier Interactive Patient Education   [employer]2018 [employer]

## 2017-01-09 DIAGNOSIS — J301 Allergic rhinitis due to pollen: Secondary | ICD-10-CM | POA: Diagnosis not present

## 2017-01-09 DIAGNOSIS — J3081 Allergic rhinitis due to animal (cat) (dog) hair and dander: Secondary | ICD-10-CM | POA: Diagnosis not present

## 2017-02-12 ENCOUNTER — Ambulatory Visit: Payer: 59 | Admitting: Physician Assistant

## 2017-02-12 ENCOUNTER — Other Ambulatory Visit: Payer: Self-pay | Admitting: Physician Assistant

## 2017-03-05 IMAGING — CT CT L SPINE W/ CM
1 of 7 series · 6 of 14 positions shown, 8 images · non-contrast
Comparison: MRI 08/31/2015

CLINICAL DATA: Low back and bilateral leg gait aching.
TECHNIQUE: Contiguous axial images were obtained through the Lumbar spine after
the intrathecal infusion of infusion. Coronal and sagittal
reconstructions were obtained of the axial image sets.

[Series 3: l spine soft · axial · 0.33mm/px · z∈[+802,+1003]mm · 6 of 95 slices shown, 8 images]
[im 14/95  soft-tissue]
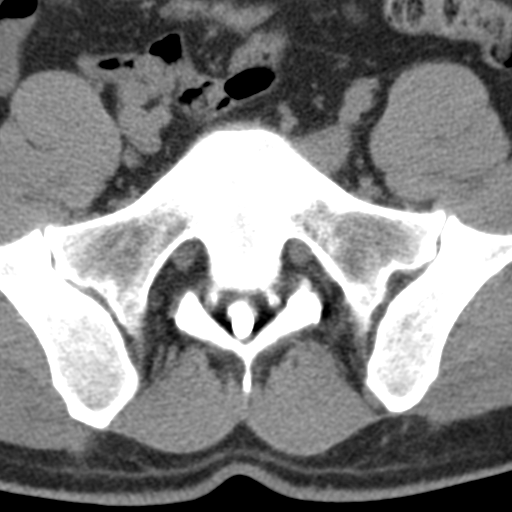
[im 14/95  bone]
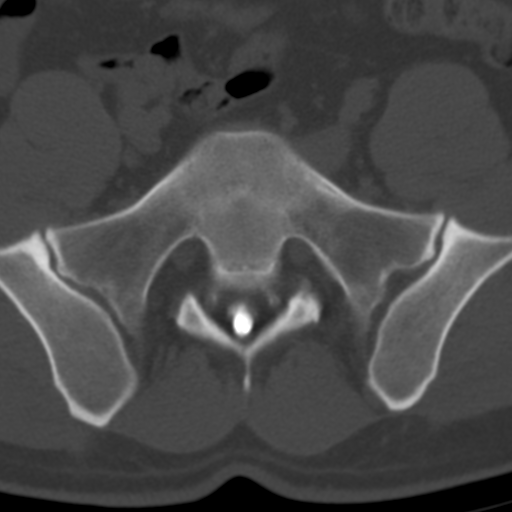
[im 27/95  bone]
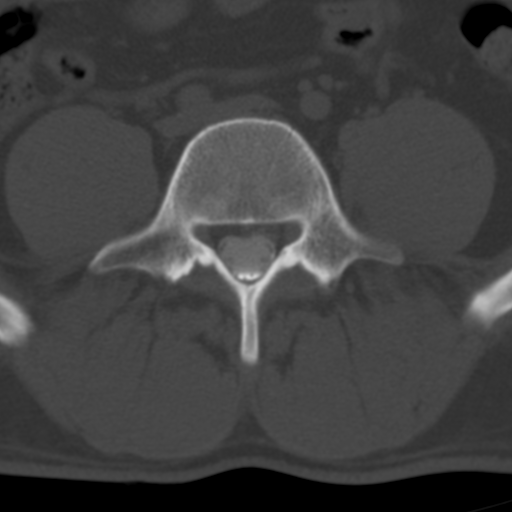
[im 41/95  bone]
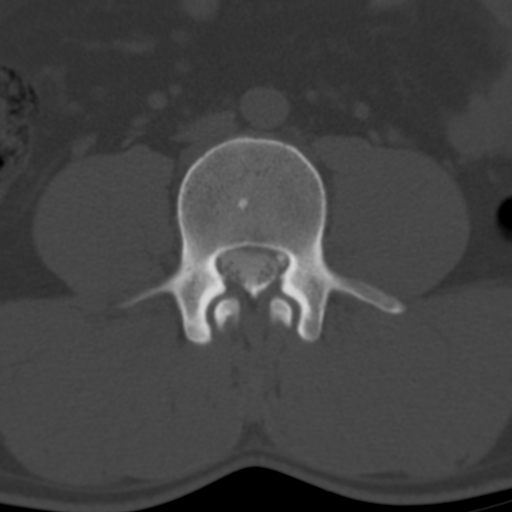
[im 54/95  bone]
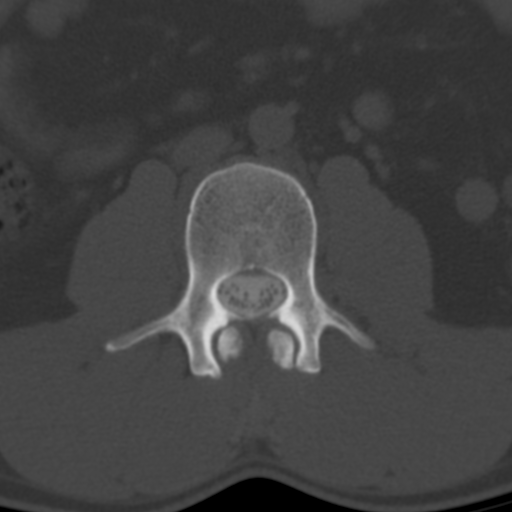
[im 68/95  soft-tissue]
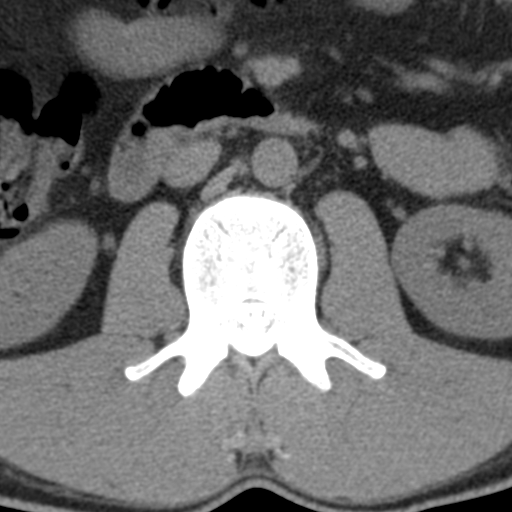
[im 68/95  bone]
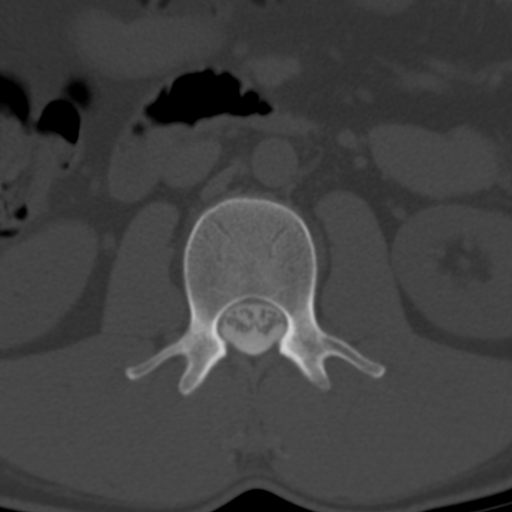
[im 81/95  bone]
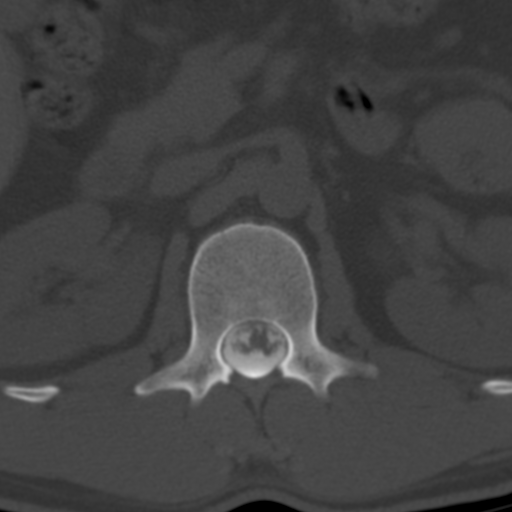

[6 of 14 positions shown; findings below may reference images not displayed]

EXAM:
LUMBAR MYELOGRAM

FLUOROSCOPY TIME:  0 minutes 37 seconds. 169.87 micro gray meter
squared

PROCEDURE:
After thorough discussion of risks and benefits of the procedure
including bleeding, infection, injury to nerves, blood vessels,
adjacent structures as well as headache and CSF leak, written and
oral informed consent was obtained. Consent was obtained by Dr. Roebroek
Remy. Time out form was completed.

Patient was positioned prone on the fluoroscopy table. Local
anesthesia was provided with 1% lidocaine without epinephrine after
prepped and draped in the usual sterile fashion. Puncture was
performed at L3-4 using a 3 1/2 inch 22-gauge spinal needle via left
para median approach. Using a single pass through the dura, the
needle was placed within the thecal sac, with return of clear CSF.
15 mL of Isovue 200 was injected into the thecal sac, with normal
opacification of the nerve roots and cauda equina consistent with
free flow within the subarachnoid space.

I personally performed the lumbar puncture and administered the
intrathecal contrast. I also personally performed acquisition of the
myelogram images.
FINDINGS: LUMBAR MYELOGRAM FINDINGS:

No abnormality at L3-4 or above. Anterior extradural defect at L4-5
with mild narrowing of the lateral recesses. No extradural defect at
L5-S1. Standing flexion extension views confirm the anterior
extradural defect at L4-5. No abnormal motion occurs.

CT LUMBAR MYELOGRAM FINDINGS:

No abnormality at L3-4 or above. The discs are normal. Minimal mixed
injection at L3-4. Incidental secondary ossification centers at the
tips of the inferior facets of L2, not significant.

L4-5: Disc degeneration with broad-based protrusion. Indentation of
the thecal sac with narrowing of both lateral recesses. Mild facet
osteoarthritis.

L5-S1: Circumferential disc protrusion. No compressive effect upon
the thecal sac or S1 nerve roots. Foraminal encroachment bilaterally
that could affect the exiting L5 nerve roots. Mild facet
osteoarthritis.

The patient does demonstrate some sacroiliac arthropathy as well.
IMPRESSION: Negative at L3-4 and above.

L4-5: Broad-based disc protrusion indents the thecal sac and narrows
both lateral recesses. Mild facet osteoarthritis at this level.

L5-S1: Broad-based disc protrusion without any visible affect upon
the thecal sac or S1 nerve roots. Mild foraminal encroachment could
have some affect upon the exiting L5 nerve roots. Mild facet
osteoarthritis at this level.

Bilateral sacroiliac arthropathy, premature for age.

## 2017-03-14 ENCOUNTER — Encounter: Payer: Self-pay | Admitting: Physician Assistant

## 2017-03-14 ENCOUNTER — Ambulatory Visit (INDEPENDENT_AMBULATORY_CARE_PROVIDER_SITE_OTHER): Payer: 59 | Admitting: Physician Assistant

## 2017-03-14 VITALS — BP 112/70 | HR 62 | Temp 98.1°F | Resp 14 | Ht 74.0 in | Wt 200.0 lb

## 2017-03-14 DIAGNOSIS — M545 Low back pain, unspecified: Secondary | ICD-10-CM

## 2017-03-14 DIAGNOSIS — R6882 Decreased libido: Secondary | ICD-10-CM | POA: Diagnosis not present

## 2017-03-14 MED ORDER — TRAMADOL HCL ER 100 MG PO TB24: ORAL_TABLET | ORAL | 0 refills | Status: DC

## 2017-03-14 NOTE — Assessment & Plan Note (Signed)
Stable. Continue current regimen. Follow-up with specialist as scheduled.

## 2017-03-14 NOTE — Patient Instructions (Signed)
Please keep up with the stretching exercises. Please continue medication as directed when needed. Please have your Neurosurgeon send me notes from your assessment.  Use the handout given to schedule an appointment with one of our Psychologists. You will be in very good hands.  Please speak with Marylene Landngela at the front desk to schedule a morning lab appointment at our [place]. We will check your testosterone levels.

## 2017-03-14 NOTE — Assessment & Plan Note (Signed)
Will obtain AM testosterone levels.

## 2017-03-14 NOTE — Progress Notes (Signed)
Pre visit review using our clinic review tool, if applicable. No additional management support is needed unless otherwise documented below in the visit note. 

## 2017-03-14 NOTE — Progress Notes (Signed)
   Patient presents to clinic today for follow-up. 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 Takes once daily about 3-4 times per week. More on weeks where he has training. Has appointment scheduled with Neurosurgery next week.  Patient would like testosterone testing due to decreased libido. This has been discussed before but patient did not want assessment at that time.    Past Medical History:  Diagnosis Date  . Acid reflux   . History of chicken pox   . Lyme disease    Unknown    Current Outpatient Prescriptions on File Prior to Visit  Medication Sig Dispense Refill  . azelastine (OPTIVAR) 0.05 % ophthalmic solution Place 1 drop into both eyes 2 (two) times daily. 6 mL 0  . Multiple Vitamin (MULTIVITAMIN) tablet Take 1 tablet by mouth daily.    Marland Kitchen. omeprazole (PRILOSEC) 20 MG capsule take 1 capsule by mouth once daily 30 capsule 3   No current facility-administered medications on file prior to visit.     No Known Allergies  Family History  Problem Relation Age of Onset  . Healthy Mother        Living  . Healthy Father        Living  . Lung cancer Maternal Grandfather   . Throat cancer Maternal Grandfather   . Cancer Maternal Uncle   . Healthy Brother        x3    Social History   Social History  . Marital status: Single    Spouse name: N/A  . Number of children: N/A  . Years of education: N/A   Social History Main Topics  . Smoking status: Former Smoker    Types: Cigarettes  . Smokeless tobacco: Never Used     Comment: Quit >5 yrs  . Alcohol use 0.0 oz/week     Comment: rare  . Drug use: No  . Sexual activity: Not Asked   Other Topics Concern  . None   Social History Narrative  . None   Review of Systems - See HPI.  All other ROS are negative.  BP 112/70   Pulse 62   Temp 98.1 F (36.7 C) (Oral)   Resp 14   Ht 6\' 2"  (1.88 m)   Wt 200 lb (90.7 kg)   SpO2 98%   BMI 25.68 kg/m   Physical Exam    Constitutional: He is oriented to person, place, and time and well-developed, well-nourished, and in no distress.  HENT:  Head: Normocephalic and atraumatic.  Eyes: Conjunctivae are normal.  Neck: Neck supple.  Cardiovascular: Normal rate, regular rhythm, normal heart sounds and intact distal pulses.   Pulmonary/Chest: Effort normal and breath sounds normal. No respiratory distress. He has no wheezes. He has no rales. He exhibits no tenderness.  Neurological: He is alert and oriented to person, place, and time.  Skin: Skin is warm and dry. No rash noted.  Psychiatric: Affect normal.  Vitals reviewed.  Assessment/Plan: Decreased libido Will obtain AM testosterone levels.   Back pain, lumbosacral Stable. Continue current regimen. Follow-up with specialist as scheduled.    Piedad ClimesMartin, Stevie Cody, PA-C

## 2017-03-16 ENCOUNTER — Other Ambulatory Visit (INDEPENDENT_AMBULATORY_CARE_PROVIDER_SITE_OTHER): Payer: 59

## 2017-03-16 DIAGNOSIS — R6882 Decreased libido: Secondary | ICD-10-CM

## 2017-03-16 LAB — TESTOSTERONE: TESTOSTERONE: 346.34 ng/dL (ref 300.00–890.00)

## 2017-03-22 DIAGNOSIS — R454 Irritability and anger: Secondary | ICD-10-CM | POA: Diagnosis not present

## 2017-04-03 DIAGNOSIS — M5440 Lumbago with sciatica, unspecified side: Secondary | ICD-10-CM | POA: Diagnosis not present

## 2017-04-03 DIAGNOSIS — M5127 Other intervertebral disc displacement, lumbosacral region: Secondary | ICD-10-CM | POA: Diagnosis not present

## 2017-04-03 DIAGNOSIS — M5136 Other intervertebral disc degeneration, lumbar region: Secondary | ICD-10-CM | POA: Diagnosis not present

## 2017-09-04 DIAGNOSIS — R454 Irritability and anger: Secondary | ICD-10-CM | POA: Diagnosis not present

## 2017-09-04 DIAGNOSIS — G479 Sleep disorder, unspecified: Secondary | ICD-10-CM | POA: Diagnosis not present

## 2018-01-10 DIAGNOSIS — K219 Gastro-esophageal reflux disease without esophagitis: Secondary | ICD-10-CM | POA: Diagnosis not present

## 2018-01-10 DIAGNOSIS — Z13220 Encounter for screening for lipoid disorders: Secondary | ICD-10-CM | POA: Diagnosis not present

## 2018-01-10 DIAGNOSIS — R6882 Decreased libido: Secondary | ICD-10-CM | POA: Diagnosis not present

## 2018-02-06 DIAGNOSIS — R51 Headache: Secondary | ICD-10-CM | POA: Diagnosis not present

## 2018-03-19 DIAGNOSIS — Z8241 Family history of sudden cardiac death: Secondary | ICD-10-CM | POA: Diagnosis not present

## 2018-07-22 ENCOUNTER — Encounter: Payer: Self-pay | Admitting: Physician Assistant

## 2018-07-22 DIAGNOSIS — J302 Other seasonal allergic rhinitis: Secondary | ICD-10-CM

## 2018-08-20 ENCOUNTER — Ambulatory Visit: Payer: Self-pay | Admitting: Physician Assistant

## 2018-08-20 NOTE — Telephone Encounter (Signed)
Pt called in c/o still having sore throat and coughing.   He has been seen at the urgent care.   See  Triage notes.  He wanted to know if he needed testing for the coronavirus.     Reason for Disposition . Cold with no complications  Answer Assessment - Initial Assessment Questions 1. ONSET: "When did the nasal discharge start?"      2 weeks ago my wife and I both came down with sore throats, coughing and just feeling bad.   We [place] urgent care [place] and they tested [place] for the flu and strep throat.   All of that was negative but treated [place] with Tamiflu anyway. I wasn't getting any better so I went back to the urgent care and they prescribed Amoxicillin.   I was wondering if I needed to be tested for the coronavirus in light of the coronavirus pandemic going on right now. I let him know he was not exhibiting the correct symptoms to be tested and that we were no longer testing people unless it would change the course of their treatment.    2. AMOUNT: "How much discharge is there?"      Not much now.  3. COUGH: "Do you have a cough?" If yes, ask: "Describe the color of your sputum" (clear, white, yellow, green)     Yes.   Coughing a lot.  It's better but still bad. 4. RESPIRATORY DISTRESS: "Describe your breathing."      No problems 5. FEVER: "Do you have a fever?" If so, ask: "What is your temperature, how was it measured, and when did it start?"     No 6. SEVERITY: "Overall, how bad are you feeling right now?" (e.g., doesn't interfere with normal activities, staying home from school/work, staying in bed)      I'm still feeling bad but I'm going to start the Amoxicillin the urgent care PA prescribed.    "I just wanted to check with y'all and make sure I didn't need to be tested for the coronavirus".   7. OTHER SYMPTOMS: "Do you have any other symptoms?" (e.g., sore throat, earache, wheezing, vomiting)     Sore throat 8. PREGNANCY: "Is there any chance you are pregnant?" "When was  your last menstrual period?"     N/A  Protocols used: COMMON COLD-A-AH

## 2018-12-10 ENCOUNTER — Other Ambulatory Visit: Payer: Self-pay

## 2018-12-10 ENCOUNTER — Encounter: Payer: Self-pay | Admitting: Physician Assistant

## 2018-12-10 ENCOUNTER — Ambulatory Visit (INDEPENDENT_AMBULATORY_CARE_PROVIDER_SITE_OTHER): Payer: 59 | Admitting: Physician Assistant

## 2018-12-10 VITALS — BP 128/70 | HR 90 | Temp 98.9°F | Resp 14 | Ht 74.0 in | Wt 198.0 lb

## 2018-12-10 DIAGNOSIS — G8929 Other chronic pain: Secondary | ICD-10-CM | POA: Diagnosis not present

## 2018-12-10 DIAGNOSIS — M545 Low back pain, unspecified: Secondary | ICD-10-CM

## 2018-12-10 DIAGNOSIS — Z0001 Encounter for general adult medical examination with abnormal findings: Secondary | ICD-10-CM

## 2018-12-10 DIAGNOSIS — Z0000 Encounter for general adult medical examination without abnormal findings: Secondary | ICD-10-CM

## 2018-12-10 MED ORDER — GABAPENTIN 100 MG PO CAPS: 100.0000 mg | ORAL_CAPSULE | Freq: Two times a day (BID) | ORAL | 1 refills | Status: DC

## 2018-12-10 MED ORDER — METHOCARBAMOL 500 MG PO TABS: 500.0000 mg | ORAL_TABLET | Freq: Every evening | ORAL | 1 refills | Status: DC | PRN

## 2018-12-10 NOTE — Patient Instructions (Signed)
Please go to the lab for blood work.   Our office will call you with your results unless you have chosen to receive results via MyChart.  If your blood work is normal we will follow-up each year for physicals and as scheduled for chronic medical problems.  If anything is abnormal we will treat accordingly and get you in for a follow-up.  Please start the Gabapentin taking twice daily.  After making sure you tolerate well for a few days, start the Robaxin in the evening. Follow-up with specialist as scheduled. If you have not seen them within a month, follow-up with me via video visit for further medication titration.  Hang in there!   Preventive Care 29-63 Years Old, Male Preventive care refers to lifestyle choices and visits with your health care provider that can promote health and wellness. This includes:  A yearly physical exam. This is also called an annual well check.  Regular dental and eye exams.  Immunizations.  Screening for certain conditions.  Healthy lifestyle choices, such as eating a healthy diet, getting regular exercise, not using drugs or products that contain nicotine and tobacco, and limiting alcohol use. What can I expect for my preventive care visit? Physical exam Your health care provider will check:  Height and weight. These may be used to calculate body mass index (BMI), which is a measurement that tells if you are at a healthy weight.  Heart rate and blood pressure.  Your skin for abnormal spots. Counseling Your health care provider may ask you questions about:  Alcohol, tobacco, and drug use.  Emotional well-being.  Home and relationship well-being.  Sexual activity.  Eating habits.  Work and work Statistician. What immunizations do I need?  Influenza (flu) vaccine  This is recommended every year. Tetanus, diphtheria, and pertussis (Tdap) vaccine  You may need a Td booster every 10 years. Varicella (chickenpox) vaccine  You may  need this vaccine if you have not already been vaccinated. Human papillomavirus (HPV) vaccine  If recommended by your health care provider, you may need three doses over 6 months. Measles, mumps, and rubella (MMR) vaccine  You may need at least one dose of MMR. You may also need a second dose. Meningococcal conjugate (MenACWY) vaccine  One dose is recommended if you are 30-8 years old and a Market researcher living in a residence hall, or if you have one of several medical conditions. You may also need additional booster doses. Pneumococcal conjugate (PCV13) vaccine  You may need this if you have certain conditions and were not previously vaccinated. Pneumococcal polysaccharide (PPSV23) vaccine  You may need one or two doses if you smoke cigarettes or if you have certain conditions. Hepatitis A vaccine  You may need this if you have certain conditions or if you travel or work in places where you may be exposed to hepatitis A. Hepatitis B vaccine  You may need this if you have certain conditions or if you travel or work in places where you may be exposed to hepatitis B. Haemophilus influenzae type b (Hib) vaccine  You may need this if you have certain risk factors. You may receive vaccines as individual doses or as more than one vaccine together in one shot (combination vaccines). Talk with your health care provider about the risks and benefits of combination vaccines. What tests do I need? Blood tests  Lipid and cholesterol levels. These may be checked every 5 years starting at age 34.  Hepatitis C test.  Hepatitis  B test. Screening   Diabetes screening. This is done by checking your blood sugar (glucose) after you have not eaten for a while (fasting).  Sexually transmitted disease (STD) testing. Talk with your health care provider about your test results, treatment options, and if necessary, the need for more tests. Follow these instructions at home: Eating and  drinking   Eat a diet that includes fresh fruits and vegetables, whole grains, lean protein, and low-fat dairy products.  Take vitamin and mineral supplements as recommended by your health care provider.  Do not drink alcohol if your health care provider tells you not to drink.  If you drink alcohol: ? Limit how much you have to 0-2 drinks a day. ? Be aware of how much alcohol is in your drink. In the [place] one drink equals one 12 oz bottle of beer (355 mL), one 5 oz glass of wine (148 mL), or one 1 oz glass of hard liquor (44 mL). Lifestyle  Take daily care of your teeth and gums.  Stay active. Exercise for at least 30 minutes on 5 or more days each week.  Do not use any products that contain nicotine or tobacco, such as cigarettes, e-cigarettes, and chewing tobacco. If you need help quitting, ask your health care provider.  If you are sexually active, practice safe sex. Use a condom or other form of protection to prevent STIs (sexually transmitted infections). What's next?  Go to your health care provider once a year for a well check visit.  Ask your health care provider how often you should have your eyes and teeth checked.  Stay up to date on all vaccines. This information is not intended to replace advice given to you by your health care provider. Make sure you discuss any questions you have with your health care provider. Document Released: 07/11/2001 Document Revised: 05/09/2018 Document Reviewed: 05/09/2018 Elsevier Patient Education  2020 [employer].

## 2018-12-10 NOTE — Progress Notes (Signed)
Patient presents to clinic today for annual exam.  Patient is fasting for labs. Patient is followed by [facility] who manages all chronic medications. Is seeing a Administrator for PTSD. Currently on a regimen of Lexapro 20 mg daily and Adderall 30 mg QD. Denies SI/HI.   Acute Concerns: Endorses gradual deterioration in lower back pain over the past couple of years, especially in the past 4 months. Was just recently evaluated by Provider through the [facility]. Unable to exercise anymore other than Yoga which he does every morning to help with stiffness and pain. Recommended to be set back up with a Neurosurgeon for assessment. Notes wanting to proceed with this. Denies saddle paresthesias or lower extremity numbness, tingling or weakness. Was recommended to start Gabapentin and/or Roabxin by NG PA. Would like to discuss today.   Health Maintenance: Immunizations -- UTD  Past Medical History:  Diagnosis Date  . Acid reflux   . History of chicken pox   . Lyme disease    Unknown    Past Surgical History:  Procedure Laterality Date  . ORIF FACIAL FRACTURE     Left Cheek  . WISDOM TOOTH EXTRACTION      Current Outpatient Medications on File Prior to Visit  Medication Sig Dispense Refill  . amphetamine-dextroamphetamine (ADDERALL) 30 MG tablet Take 30 mg by mouth daily.    Marland Kitchen azelastine (ASTELIN) 0.1 % nasal spray U 1 SPR IEN BID    . escitalopram (LEXAPRO) 20 MG tablet Take 20 mg by mouth daily.    . fluticasone (FLONASE) 50 MCG/ACT nasal spray SHAKE LQ AND U 1 TO 2 SPRAYS IEN QD    . levocetirizine (XYZAL) 5 MG tablet TK 1 T PO QD IN THE EVE    . omeprazole (PRILOSEC) 20 MG capsule take 1 capsule by mouth once daily 30 capsule 3   No current facility-administered medications on file prior to visit.     No Known Allergies  Family History  Problem Relation Age of Onset  . Healthy Mother        Living  . Healthy Father        Living  . Lung cancer Maternal  Grandfather   . Throat cancer Maternal Grandfather   . Cancer Maternal Uncle   . Healthy Brother        x3    Social History   Socioeconomic History  . Marital status: Single    Spouse name: Not on file  . Number of children: Not on file  . Years of education: Not on file  . Highest education level: Not on file  Occupational History  . Not on file  Social Needs  . Financial resource strain: Not on file  . Food insecurity    Worry: Not on file    Inability: Not on file  . Transportation needs    Medical: Not on file    Non-medical: Not on file  Tobacco Use  . Smoking status: Former Smoker    Types: Cigarettes  . Smokeless tobacco: Never Used  . Tobacco comment: Quit >5 yrs  Substance and Sexual Activity  . Alcohol use: Yes    Alcohol/week: 0.0 standard drinks    Comment: rare  . Drug use: No  . Sexual activity: Not on file  Lifestyle  . Physical activity    Days per week: Not on file    Minutes per session: Not on file  . Stress: Not on file  Relationships  .  Social Herbalist on phone: Not on file    Gets together: Not on file    Attends religious service: Not on file    Active member of club or organization: Not on file    Attends meetings of clubs or organizations: Not on file    Relationship status: Not on file  . Intimate partner violence    Fear of current or ex partner: Not on file    Emotionally abused: Not on file    Physically abused: Not on file    Forced sexual activity: Not on file  Other Topics Concern  . Not on file  Social History Narrative  . Not on file   Review of Systems  Constitutional: Negative for fever and weight loss.  HENT: Negative for ear discharge, ear pain, hearing loss and tinnitus.   Eyes: Negative for blurred vision, double vision, photophobia and pain.  Respiratory: Negative for cough and shortness of breath.   Cardiovascular: Negative for chest pain and palpitations.  Gastrointestinal: Negative for abdominal  pain, blood in stool, constipation, diarrhea, heartburn, melena, nausea and vomiting.  Genitourinary: Negative for dysuria, flank pain, frequency, hematuria and urgency.  Musculoskeletal: Negative for falls.  Neurological: Negative for dizziness, loss of consciousness and headaches.  Endo/Heme/Allergies: Negative for environmental allergies.  Psychiatric/Behavioral: Negative for depression, hallucinations, substance abuse and suicidal ideas. The patient is not nervous/anxious and does not have insomnia.    BP 128/70   Pulse 90   Temp 98.9 F (37.2 C) (Skin)   Resp 14   Ht '6\' 2"'$  (1.88 m)   Wt 198 lb (89.8 kg)   SpO2 99%   BMI 25.42 kg/m   Physical Exam Vitals signs reviewed.  Constitutional:      General: He is not in acute distress.    Appearance: He is well-developed. He is not diaphoretic.  HENT:     Head: Normocephalic and atraumatic.     Right Ear: Tympanic membrane, ear canal and external ear normal.     Left Ear: Tympanic membrane, ear canal and external ear normal.     Nose: Nose normal.     Mouth/Throat:     Pharynx: No posterior oropharyngeal erythema.  Eyes:     Conjunctiva/sclera: Conjunctivae normal.     Pupils: Pupils are equal, round, and reactive to light.  Neck:     Musculoskeletal: Neck supple.     Thyroid: No thyromegaly.  Cardiovascular:     Rate and Rhythm: Normal rate and regular rhythm.     Heart sounds: Normal heart sounds.  Pulmonary:     Effort: Pulmonary effort is normal. No respiratory distress.     Breath sounds: Normal breath sounds. No wheezing or rales.  Chest:     Chest wall: No tenderness.  Abdominal:     General: Bowel sounds are normal. There is no distension.     Palpations: Abdomen is soft. There is no mass.     Tenderness: There is no abdominal tenderness. There is no guarding or rebound.  Lymphadenopathy:     Cervical: No cervical adenopathy.  Skin:    General: Skin is warm and dry.     Findings: No rash.  Neurological:      Mental Status: He is alert and oriented to person, place, and time.     Cranial Nerves: No cranial nerve deficit.    Assessment/Plan: 1. Chronic bilateral low back pain without sciatica DDD with ongoing deterioration, concern for herniated disc. Upcoming appt  with Neurosurgery. Will start Gabapentin 100 mg BID and Robaxin at night for now. Close follow-up scheduled for titration of Gabapentin. Tylenol or Aleve as needed. Continue yoga. - methocarbamol (ROBAXIN) 500 MG tablet; Take 1 tablet (500 mg total) by mouth at bedtime as needed for muscle spasms.  Dispense: 30 tablet; Refill: 1 - gabapentin (NEURONTIN) 100 MG capsule; Take 1 capsule (100 mg total) by mouth 2 (two) times daily.  Dispense: 60 capsule; Refill: 1  2. Visit for preventive health examination Depression screen negative. Health Maintenance reviewed. Preventive schedule discussed and handout given in AVS. Will obtain fasting labs today.  - CBC w/Diff - Comp Met (CMET) - Lipid Profile - Hemoglobin A1c   Leeanne Rio, [place]

## 2018-12-11 LAB — LIPID PANEL
Cholesterol: 191 mg/dL (ref 0–200)
HDL: 55.6 mg/dL (ref >39.00)
LDL Cholesterol: 115 mg/dL — ABNORMAL HIGH (ref 0–99)
NonHDL: 135.21
Total CHOL/HDL Ratio: 3
Triglycerides: 102 mg/dL (ref 0.0–149.0)
VLDL: 20.4 mg/dL (ref 0.0–40.0)

## 2018-12-11 LAB — CBC WITH DIFFERENTIAL/PLATELET
Basophils Absolute: 0 10*3/uL (ref 0.0–0.1)
Basophils Relative: 0.6 % (ref 0.0–3.0)
Eosinophils Absolute: 0 10*3/uL (ref 0.0–0.7)
Eosinophils Relative: 0.6 % (ref 0.0–5.0)
HCT: 42.7 % (ref 39.0–52.0)
Hemoglobin: 14.5 g/dL (ref 13.0–17.0)
Lymphocytes Relative: 20.6 % (ref 12.0–46.0)
Lymphs Abs: 1.6 10*3/uL (ref 0.7–4.0)
MCHC: 33.9 g/dL (ref 30.0–36.0)
MCV: 89.8 fl (ref 78.0–100.0)
Monocytes Absolute: 0.4 10*3/uL (ref 0.1–1.0)
Monocytes Relative: 5.4 % (ref 3.0–12.0)
Neutro Abs: 5.7 10*3/uL (ref 1.4–7.7)
Neutrophils Relative %: 72.8 % (ref 43.0–77.0)
Platelets: 205 10*3/uL (ref 150.0–400.0)
RBC: 4.76 Mil/uL (ref 4.22–5.81)
RDW: 12.5 % (ref 11.5–15.5)
WBC: 7.8 10*3/uL (ref 4.0–10.5)

## 2018-12-11 LAB — COMPREHENSIVE METABOLIC PANEL
ALT: 21 U/L (ref 0–53)
AST: 22 U/L (ref 0–37)
Albumin: 4.9 g/dL (ref 3.5–5.2)
Alkaline Phosphatase: 64 U/L (ref 39–117)
BUN: 12 mg/dL (ref 6–23)
CO2: 29 mEq/L (ref 19–32)
Calcium: 9.6 mg/dL (ref 8.4–10.5)
Chloride: 102 mEq/L (ref 96–112)
Creatinine, Ser: 1.04 mg/dL (ref 0.40–1.50)
GFR: 83.12 mL/min (ref >60.00)
Glucose, Bld: 119 mg/dL — ABNORMAL HIGH (ref 70–99)
Potassium: 3.7 mEq/L (ref 3.5–5.1)
Sodium: 137 mEq/L (ref 135–145)
Total Bilirubin: 0.8 mg/dL (ref 0.2–1.2)
Total Protein: 7 g/dL (ref 6.0–8.3)

## 2018-12-12 LAB — HEMOGLOBIN A1C: Hgb A1c MFr Bld: 5.2 % (ref 4.6–6.5)

## 2018-12-26 ENCOUNTER — Other Ambulatory Visit: Payer: Self-pay | Admitting: Physician Assistant

## 2018-12-26 ENCOUNTER — Encounter: Payer: Self-pay | Admitting: Physician Assistant

## 2018-12-26 DIAGNOSIS — Z20822 Contact with and (suspected) exposure to covid-19: Secondary | ICD-10-CM

## 2018-12-28 LAB — NOVEL CORONAVIRUS, NAA: SARS-CoV-2, NAA: NOT DETECTED

## 2020-03-30 ENCOUNTER — Ambulatory Visit: Admitting: Physician Assistant

## 2020-04-05 ENCOUNTER — Encounter: Payer: Self-pay | Admitting: Physician Assistant

## 2020-04-05 ENCOUNTER — Ambulatory Visit (INDEPENDENT_AMBULATORY_CARE_PROVIDER_SITE_OTHER): Admitting: Physician Assistant

## 2020-04-05 ENCOUNTER — Other Ambulatory Visit: Payer: Self-pay

## 2020-04-05 VITALS — BP 112/80 | HR 95 | Temp 98.3°F | Resp 16 | Ht 74.0 in | Wt 210.0 lb

## 2020-04-05 DIAGNOSIS — R079 Chest pain, unspecified: Secondary | ICD-10-CM

## 2020-04-05 DIAGNOSIS — J302 Other seasonal allergic rhinitis: Secondary | ICD-10-CM

## 2020-04-05 DIAGNOSIS — K219 Gastro-esophageal reflux disease without esophagitis: Secondary | ICD-10-CM

## 2020-04-05 DIAGNOSIS — M545 Low back pain, unspecified: Secondary | ICD-10-CM | POA: Diagnosis not present

## 2020-04-05 MED ORDER — VORTIOXETINE HBR 20 MG PO TABS: 20.0000 mg | ORAL_TABLET | Freq: Every day | ORAL | 2 refills | Status: DC

## 2020-04-05 NOTE — Progress Notes (Signed)
Patient presents to clinic today c/o 2 weeks of left chest pain occurring several times throughout the day, typically averaging about 3-4 times per day, and lasting about 2-5 minutes each. Occasional radiation into his neck. Denies radiation down his arm. Sometimes associated with pressure sensation. Denies racing heart, lightheadedness, dizziness, diaphoresis or SOB. Is a very physical fit person, working out several times per week. Is unable to reproduce pain with palpation or ROM. Brother had a heart attack in his late 99s -- was on cocaine but did have some congenital heart issues per patient (unsure of type).   Patient also wanting to transfer the entirety of his care back to [place] from the [place] as he is very unhappy with his care there. Patient with history of depression and ADD, currently on Lexapro 20 mg daily and Adderall 10 mg XR daily. Is taking Lexapro as directed with good improvement in mood but is causing anorgasmia. Notes this is a very significant problem. Also with the Adderall he notes that he cannot really tell that it is doing anything.   Past Medical History:  Diagnosis Date  . Acid reflux   . History of chicken pox   . Lyme disease    Unknown    Current Outpatient Medications on File Prior to Visit  Medication Sig Dispense Refill  . amphetamine-dextroamphetamine (ADDERALL) 30 MG tablet Take 30 mg by mouth daily.    Marland Kitchen azelastine (ASTELIN) 0.1 % nasal spray U 1 SPR IEN BID    . escitalopram (LEXAPRO) 20 MG tablet Take 20 mg by mouth daily.    . fluticasone (FLONASE) 50 MCG/ACT nasal spray SHAKE LQ AND U 1 TO 2 SPRAYS IEN QD    . gabapentin (NEURONTIN) 100 MG capsule Take 1 capsule (100 mg total) by mouth 2 (two) times daily. 60 capsule 1  . levocetirizine (XYZAL) 5 MG tablet TK 1 T PO QD IN THE EVE    . omeprazole (PRILOSEC) 20 MG capsule take 1 capsule by mouth once daily 30 capsule 3   No current facility-administered medications on file prior to visit.    No Known  Allergies  Family History  Problem Relation Age of Onset  . Healthy Mother        Living  . Healthy Father        Living  . Lung cancer Maternal Grandfather   . Throat cancer Maternal Grandfather   . Cancer Maternal Uncle   . Healthy Brother        x3  . Heart attack Brother     Social History   Socioeconomic History  . Marital status: Single    Spouse name: Not on file  . Number of children: Not on file  . Years of education: Not on file  . Highest education level: Not on file  Occupational History  . Not on file  Tobacco Use  . Smoking status: Former Smoker    Types: Cigarettes  . Smokeless tobacco: Never Used  . Tobacco comment: Quit >5 yrs  Vaping Use  . Vaping Use: Never used  Substance and Sexual Activity  . Alcohol use: Yes    Alcohol/week: 0.0 standard drinks    Comment: rare  . Drug use: No  . Sexual activity: Yes  Other Topics Concern  . Not on file  Social History Narrative  . Not on file   Social Determinants of Health   Financial Resource Strain:   . Difficulty of Paying Living Expenses: Not on file  Food Insecurity:   . Worried About Programme researcher, broadcasting/film/video in the Last Year: Not on file  . Ran Out of Food in the Last Year: Not on file  Transportation Needs:   . Lack of Transportation (Medical): Not on file  . Lack of Transportation (Non-Medical): Not on file  Physical Activity:   . Days of Exercise per Week: Not on file  . Minutes of Exercise per Session: Not on file  Stress:   . Feeling of Stress : Not on file  Social Connections:   . Frequency of Communication with Friends and Family: Not on file  . Frequency of Social Gatherings with Friends and Family: Not on file  . Attends Religious Services: Not on file  . Active Member of Clubs or Organizations: Not on file  . Attends Banker Meetings: Not on file  . Marital Status: Not on file   Review of Systems - See HPI.  All other ROS are negative.  BP 112/80   Pulse 95   Temp  98.3 F (36.8 C) (Temporal)   Resp 16   Ht 6\' 2"  (1.88 m)   Wt 210 lb (95.3 kg)   SpO2 99%   BMI 26.96 kg/m   Physical Exam Vitals reviewed.  Constitutional:      Appearance: He is well-developed.  HENT:     Head: Normocephalic and atraumatic.  Eyes:     Pupils: Pupils are equal, round, and reactive to light.  Cardiovascular:     Rate and Rhythm: Normal rate and regular rhythm.     Heart sounds: Normal heart sounds.  Pulmonary:     Effort: Pulmonary effort is normal.     Breath sounds: Normal breath sounds.  Chest:     Chest wall: No swelling or tenderness.  Abdominal:     General: Abdomen is flat. Bowel sounds are normal.     Palpations: Abdomen is soft.     Tenderness: There is no abdominal tenderness.  Musculoskeletal:     Right shoulder: Normal.     Left shoulder: Normal.     Cervical back: Neck supple.  Neurological:     General: No focal deficit present.     Mental Status: He is alert and oriented to person, place, and time.  Psychiatric:        Mood and Affect: Mood normal.    Assessment/Plan: 1. Seasonal allergic rhinitis, unspecified trigger Overall well controlled.  Supportive measures and OTC recommendations reviewed with patient.  2. Gastroesophageal reflux disease without esophagitis Stable.  Continue current medication regimen.  Continue GERD diet.  3. Back pain, lumbosacral Managed by specialist.  Continue care as directed by provider.  4. Left-sided chest pain Ongoing.  Not specifically exertional.  Is somewhat improved by leaning forward.  Does have a brother that had a heart attack in his 33s but has some underlying conditions and was on cocaine at the time of his event.  Patient asymptomatic at time of visit.  Examination unremarkable.  EKG today reveals normal sinus rhythm.  No evidence of prior event or pericarditis.  EKG findings.  Patient to avoid heavy lifting or overexertion.  Avoid alcohol and caffeine.  Will hold stimulant medication until  this is worked up. Referral to cardiology placed for further evaluation just to be extra cautious.  Strict ER precautions reviewed with patient who voiced understanding and agreement with the plan. - EKG 12-Lead - Ambulatory referral to Cardiology  This visit occurred during the SARS-CoV-2 public health  emergency.  Safety protocols were in place, including screening questions prior to the visit, additional usage of staff PPE, and extensive cleaning of exam room while observing appropriate contact time as indicated for disinfecting solutions.     Leeanne Rio, PA-C

## 2020-04-05 NOTE — Patient Instructions (Signed)
Keep your phone on.  You will be contacted for evaluation by Cardiology.  If you do not hear from them within 1 week, please let me know.  Avoid caffeine and alcohol.  Keep hydrated.  No intense exercise. I would refrain from use of the Adderall until we get this worked up.   Stop the Lexapro and start the Trintellix once daily in its place.  If you have any issue getting this medication, please let me know.   Schedule a follow-up at your convenience to talk about other concerns from [facility].   So good to see you again.  Hang in there!

## 2020-04-08 ENCOUNTER — Encounter: Payer: Self-pay | Admitting: Physician Assistant

## 2020-04-08 ENCOUNTER — Encounter: Payer: Self-pay | Admitting: Emergency Medicine

## 2020-04-08 DIAGNOSIS — F339 Major depressive disorder, recurrent, unspecified: Secondary | ICD-10-CM | POA: Insufficient documentation

## 2020-04-08 DIAGNOSIS — F5232 Male orgasmic disorder: Secondary | ICD-10-CM | POA: Insufficient documentation

## 2020-04-08 DIAGNOSIS — F4310 Post-traumatic stress disorder, unspecified: Secondary | ICD-10-CM | POA: Insufficient documentation

## 2020-04-12 ENCOUNTER — Telehealth: Payer: Self-pay | Admitting: Emergency Medicine

## 2020-04-12 NOTE — Telephone Encounter (Signed)
Trintellix medication was approved thru [employer] from 04/09/2020-05/28/2098  Trintellix needing a prior authorization by patient insurance. Patient has [employer] and completed forms and faxed back on 04/09/2020.

## 2020-04-18 NOTE — Progress Notes (Signed)
Cardiology Office Note:    Date:  04/19/2020   ID:  Joshua Fry, DOB [birth year], MRN [id number]  PCP:  Brunetta Jeans, PA-C  Cardiologist:  No primary care provider on file.  Electrophysiologist:  None   Referring MD: Brunetta Jeans, PA-C   Chief Complaint  Patient presents with  . Chest Pain    History of Present Illness:    Joshua Fry is a 34 y.o. male with a hx of GERD who is referred by Raiford Noble, PA for evaluation of chest pain.  He reports that he has been having sharp pains in his chest that started about 1 month ago.  Occurs in the bottom of part of his chest sometimes at the time of his chest.  Describes as sharp stabbing pain that typically last for couple minutes.  Does note that certain positions can make it worse, though has not noted any relationship with exertion or deep breathing.  Has not exercised for the last 9 months, most exertion he does his yard work.  Has been having episodes 3-4 times per day.  Denies any dyspnea, lightheadedness, syncope, lower extremity edema.  Denies any palpitations but did report during one episode of chest pain felt like his heart was going fast.  No smoking history  Brother had SCD at age 76.  Occurred while playing basketball.  Reports was told work-up unremarkable.      Past Medical History:  Diagnosis Date  . Acid reflux   . History of chicken pox   . Lyme disease    Unknown    Past Surgical History:  Procedure Laterality Date  . ORIF FACIAL FRACTURE     Left Cheek  . WISDOM TOOTH EXTRACTION      Current Medications: Current Meds  Medication Sig  . amphetamine-dextroamphetamine (ADDERALL) 30 MG tablet Take 30 mg by mouth daily.  Marland Kitchen azelastine (ASTELIN) 0.1 % nasal spray U 1 SPR IEN BID  . fluticasone (FLONASE) 50 MCG/ACT nasal spray SHAKE LQ AND U 1 TO 2 SPRAYS IEN QD  . gabapentin (NEURONTIN) 300 MG capsule Take 300 mg by mouth 3 (three) times daily.  Marland Kitchen levocetirizine (XYZAL) 5 MG tablet TK 1  T PO QD IN THE EVE  . omeprazole (PRILOSEC) 20 MG capsule take 1 capsule by mouth once daily  . vortioxetine HBr (TRINTELLIX) 20 MG TABS tablet Take 1 tablet (20 mg total) by mouth daily.     Allergies:   Patient has no known allergies.   Social History   Socioeconomic History  . Marital status: Single    Spouse name: Not on file  . Number of children: Not on file  . Years of education: Not on file  . Highest education level: Not on file  Occupational History  . Not on file  Tobacco Use  . Smoking status: Former Smoker    Types: Cigarettes  . Smokeless tobacco: Never Used  . Tobacco comment: Quit >5 yrs  Vaping Use  . Vaping Use: Never used  Substance and Sexual Activity  . Alcohol use: Yes    Alcohol/week: 0.0 standard drinks    Comment: rare  . Drug use: No  . Sexual activity: Yes  Other Topics Concern  . Not on file  Social History Narrative  . Not on file   Social Determinants of Health   Financial Resource Strain:   . Difficulty of Paying Living Expenses: Not on file  Food Insecurity:   . Worried About Estate manager/land agent  of Food in the Last Year: Not on file  . Ran Out of Food in the Last Year: Not on file  Transportation Needs:   . Lack of Transportation (Medical): Not on file  . Lack of Transportation (Non-Medical): Not on file  Physical Activity:   . Days of Exercise per Week: Not on file  . Minutes of Exercise per Session: Not on file  Stress:   . Feeling of Stress : Not on file  Social Connections:   . Frequency of Communication with Friends and Family: Not on file  . Frequency of Social Gatherings with Friends and Family: Not on file  . Attends Religious Services: Not on file  . Active Member of Clubs or Organizations: Not on file  . Attends Archivist Meetings: Not on file  . Marital Status: Not on file     Family History: The patient's family history includes Cancer in his maternal uncle; Healthy in his brother, father, and mother; Heart  attack in his brother; Lung cancer in his maternal grandfather; Throat cancer in his maternal grandfather.  ROS:   Please see the history of present illness.     All other systems reviewed and are negative.  EKGs/Labs/Other Studies Reviewed:    The following studies were reviewed today:   EKG:  EKG is  ordered today.  The ekg ordered today demonstrates normal sinus rhythm, no ST abnormalities  Recent Labs: 04/19/2020: BUN 10; Creatinine, Ser 0.89; Hemoglobin 14.8; Platelets 191; Potassium 4.5; Sodium 140  Recent Lipid Panel    Component Value Date/Time   CHOL 191 12/10/2018 1543   TRIG 102.0 12/10/2018 1543   HDL 55.60 12/10/2018 1543   CHOLHDL 3 12/10/2018 1543   VLDL 20.4 12/10/2018 1543   LDLCALC 115 (H) 12/10/2018 1543    Physical Exam:    VS:  BP (!) 142/84   Pulse 65   Ht _0  (1.905 m)   Wt 215 lb 3.2 oz (97.6 kg)   SpO2 98%   BMI 26.90 kg/m     Wt Readings from Last 3 Encounters:  04/19/20 215 lb 3.2 oz (97.6 kg)  04/05/20 210 lb (95.3 kg)  12/10/18 198 lb (89.8 kg)     GEN:  Well nourished, well developed in no acute distress HEENT: Normal NECK: No JVD; No carotid bruits LYMPHATICS: No lymphadenopathy CARDIAC: RRR, no murmurs, rubs, gallops RESPIRATORY:  Clear to auscultation without rales, wheezing or rhonchi  ABDOMEN: Soft, non-tender, non-distended MUSCULOSKELETAL:  No edema; No deformity  SKIN: Warm and dry NEUROLOGIC:  Alert and oriented x 3 PSYCHIATRIC:  Normal affect   ASSESSMENT:    1. Chest pain of uncertain etiology    PLAN:    Chest pain: Atypical in description, but given his family history (brother with sudden cardiac death age 35), recommend further evaluation.  Will check ETT and echocardiogram.  His pain is not pleuritic but can be worsened by certain positions.  Will check ESR/CRP  RTC in 1 month   The risks [chest pain, shortness of breath, cardiac arrhythmias, dizziness, blood pressure fluctuations, myocardial infarction,  stroke/transient ischemic attack, and life-threatening complications (estimated to be 1 in 10,000)], benefits (risk stratification, diagnosing coronary artery disease, treatment guidance) and alternatives of an exercise tolerance test were discussed in detail with Joshua Fry and he agrees to proceed.     Medication Adjustments/Labs and Tests Ordered: Current medicines are reviewed at length with the patient today.  Concerns regarding medicines are outlined above.  Orders Placed  This Encounter  Procedures  . Basic metabolic panel  . CBC  . C-reactive protein  . Sedimentation rate  . EXERCISE TOLERANCE TEST (ETT)  . EKG 12-Lead  . ECHOCARDIOGRAM COMPLETE   No orders of the defined types were placed in this encounter.   Patient Instructions  Medication Instructions:  Your physician recommends that you continue on your current medications as directed. Please refer to the Current Medication list given to you today.  *If you need a refill on your cardiac medications before your next appointment, please call your pharmacy*   Lab Work: BMET, CBC, ERS, CRP today  If you have labs (blood work) drawn today and your tests are completely normal, you will receive your results only by: Marland Kitchen MyChart Message (if you have MyChart) OR . A paper copy in the mail If you have any lab test that is abnormal or we need to change your treatment, we will call you to review the results.   Testing/Procedures: Your physician has requested that you have an exercise tolerance test. For further information please visit [email or web address]. Please also follow instruction sheet, as given. (covid screening required)  Your physician has requested that you have an echocardiogram. Echocardiography is a painless test that uses sound waves to create images of your heart. It provides your doctor with information about the size and shape of your heart and how well your heart's chambers and valves are working. This  procedure takes approximately one hour. There are no restrictions for this procedure. This will be done at our [facility] location:  [place]: At [facility], you and your health needs are our priority.  As part of our continuing mission to provide you with exceptional heart care, we have created designated Provider Care Teams.  These Care Teams include your primary Cardiologist (physician) and Advanced Practice Providers (APPs -  Physician Assistants and Nurse Practitioners) who all work together to provide you with the care you need, when you need it.  We recommend signing up for the patient portal called "MyChart".  Sign up information is provided on this After Visit Summary.  MyChart is used to connect with patients for Virtual Visits (Telemedicine).  Patients are able to view lab/test results, encounter notes, upcoming appointments, etc.  Non-urgent messages can be sent to your provider as well.   To learn more about what you can do with MyChart, go to [email or web address].    Your next appointment:   1 month(s)  The format for your next appointment:   In Person  Provider:   Oswaldo Milian, MD       Signed, Donato Heinz, MD  04/19/2020 11:30 PM    [place]

## 2020-04-19 ENCOUNTER — Encounter: Payer: Self-pay | Admitting: Cardiology

## 2020-04-19 ENCOUNTER — Ambulatory Visit (INDEPENDENT_AMBULATORY_CARE_PROVIDER_SITE_OTHER): Admitting: Cardiology

## 2020-04-19 ENCOUNTER — Other Ambulatory Visit: Payer: Self-pay

## 2020-04-19 VITALS — BP 142/84 | HR 65 | Ht 75.0 in | Wt 215.2 lb

## 2020-04-19 DIAGNOSIS — R079 Chest pain, unspecified: Secondary | ICD-10-CM | POA: Diagnosis not present

## 2020-04-19 NOTE — Patient Instructions (Signed)
Medication Instructions:  Your physician recommends that you continue on your current medications as directed. Please refer to the Current Medication list given to you today.  *If you need a refill on your cardiac medications before your next appointment, please call your pharmacy*   Lab Work: BMET, CBC, ERS, CRP today  If you have labs (blood work) drawn today and your tests are completely normal, you will receive your results only by: Marland Kitchen MyChart Message (if you have MyChart) OR . A paper copy in the mail If you have any lab test that is abnormal or we need to change your treatment, we will call you to review the results.   Testing/Procedures: Your physician has requested that you have an exercise tolerance test. For further information please visit [email or web address] Please also follow instruction sheet, as given. (covid screening required)  Your physician has requested that you have an echocardiogram. Echocardiography is a painless test that uses sound waves to create images of your heart. It provides your doctor with information about the size and shape of your heart and how well your heart's chambers and valves are working. This procedure takes approximately one hour. There are no restrictions for this procedure. This will be done at our [facility] location:  [address]  Follow-Up: At [employer], you and your health needs are our priority.  As part of our continuing mission to provide you with exceptional heart care, we have created designated Provider Care Teams.  These Care Teams include your primary Cardiologist (physician) and Advanced Practice Providers (APPs -  Physician Assistants and Nurse Practitioners) who all work together to provide you with the care you need, when you need it.  We recommend signing up for the patient portal called "MyChart".  Sign up information is provided on this After Visit Summary.  MyChart is used to connect with patients for  Virtual Visits (Telemedicine).  Patients are able to view lab/test results, encounter notes, upcoming appointments, etc.  Non-urgent messages can be sent to your provider as well.   To learn more about what you can do with MyChart, go to [email or web address].    Your next appointment:   1 month(s)  The format for your next appointment:   In Person  Provider:   Epifanio Lesches, MD

## 2020-04-20 LAB — CBC
Hematocrit: 42.9 % (ref 37.5–51.0)
Hemoglobin: 14.8 g/dL (ref 13.0–17.7)
MCH: 30.7 pg (ref 26.6–33.0)
MCHC: 34.5 g/dL (ref 31.5–35.7)
MCV: 89 fL (ref 79–97)
Platelets: 191 10*3/uL (ref 150–450)
RBC: 4.82 x10E6/uL (ref 4.14–5.80)
RDW: 11.7 % (ref 11.6–15.4)
WBC: 5.1 10*3/uL (ref 3.4–10.8)

## 2020-04-20 LAB — BASIC METABOLIC PANEL
BUN/Creatinine Ratio: 11 (ref 9–20)
BUN: 10 mg/dL (ref 6–20)
CO2: 25 mmol/L (ref 20–29)
Calcium: 9.3 mg/dL (ref 8.7–10.2)
Chloride: 104 mmol/L (ref 96–106)
Creatinine, Ser: 0.89 mg/dL (ref 0.76–1.27)
GFR calc Af Amer: 131 mL/min/{1.73_m2} (ref >59)
GFR calc non Af Amer: 113 mL/min/{1.73_m2} (ref >59)
Glucose: 82 mg/dL (ref 65–99)
Potassium: 4.5 mmol/L (ref 3.5–5.2)
Sodium: 140 mmol/L (ref 134–144)

## 2020-04-20 LAB — C-REACTIVE PROTEIN: CRP: <1 mg/L (ref 0–10)

## 2020-04-20 LAB — SEDIMENTATION RATE: Sed Rate: 2 mm/hr (ref 0–15)

## 2020-04-27 ENCOUNTER — Other Ambulatory Visit (HOSPITAL_COMMUNITY)

## 2020-04-30 ENCOUNTER — Encounter (HOSPITAL_COMMUNITY)

## 2020-05-08 ENCOUNTER — Other Ambulatory Visit (HOSPITAL_COMMUNITY)
Admission: RE | Admit: 2020-05-08 | Discharge: 2020-05-08 | Disposition: A | Discharge status: Home / Self Care | Source: Ambulatory Visit | Attending: Cardiology | Admitting: Cardiology

## 2020-05-08 DIAGNOSIS — Z20822 Contact with and (suspected) exposure to covid-19: Secondary | ICD-10-CM | POA: Diagnosis not present

## 2020-05-08 DIAGNOSIS — Z01812 Encounter for preprocedural laboratory examination: Secondary | ICD-10-CM | POA: Diagnosis present

## 2020-05-08 LAB — SARS CORONAVIRUS 2 (TAT 6-24 HRS): SARS Coronavirus 2: NEGATIVE

## 2020-05-11 ENCOUNTER — Other Ambulatory Visit: Payer: Self-pay

## 2020-05-11 ENCOUNTER — Ambulatory Visit (INDEPENDENT_AMBULATORY_CARE_PROVIDER_SITE_OTHER)

## 2020-05-11 DIAGNOSIS — R079 Chest pain, unspecified: Secondary | ICD-10-CM | POA: Diagnosis not present

## 2020-05-11 LAB — ECHOCARDIOGRAM COMPLETE
AR max vel: 4.33 cm2
AV Area VTI: 4.33 cm2
AV Area mean vel: 4.15 cm2
AV Mean grad: 2 mmHg
AV Peak grad: 3.1 mmHg
Ao pk vel: 0.89 m/s
Area-P 1/2: 3.28 cm2
Calc EF: 59.5 %
S' Lateral: 3 cm
Single Plane A2C EF: 59 %
Single Plane A4C EF: 59.2 %

## 2020-05-12 ENCOUNTER — Ambulatory Visit (HOSPITAL_COMMUNITY)
Admission: RE | Admit: 2020-05-12 | Discharge: 2020-05-12 | Disposition: A | Discharge status: Home / Self Care | Source: Ambulatory Visit | Attending: Cardiology | Admitting: Cardiology

## 2020-05-12 DIAGNOSIS — R079 Chest pain, unspecified: Secondary | ICD-10-CM | POA: Diagnosis not present

## 2020-05-12 LAB — EXERCISE TOLERANCE TEST
Estimated workload: 13.9 METS
Exercise duration (min): 12 min
Exercise duration (sec): 18 s
MPHR: 188 {beats}/min
Peak HR: 184 {beats}/min
Percent HR: 97 %
Rest HR: 81 {beats}/min

## 2020-05-26 ENCOUNTER — Ambulatory Visit: Admitting: Cardiology

## 2020-06-01 ENCOUNTER — Other Ambulatory Visit: Payer: Self-pay

## 2020-06-01 ENCOUNTER — Ambulatory Visit (INDEPENDENT_AMBULATORY_CARE_PROVIDER_SITE_OTHER): Admitting: Physician Assistant

## 2020-06-01 ENCOUNTER — Encounter: Payer: Self-pay | Admitting: Physician Assistant

## 2020-06-01 VITALS — BP 120/82 | HR 80 | Temp 98.0°F | Resp 14 | Ht 75.0 in | Wt 214.0 lb

## 2020-06-01 DIAGNOSIS — F4310 Post-traumatic stress disorder, unspecified: Secondary | ICD-10-CM | POA: Diagnosis not present

## 2020-06-01 DIAGNOSIS — F339 Major depressive disorder, recurrent, unspecified: Secondary | ICD-10-CM

## 2020-06-01 NOTE — Progress Notes (Signed)
Patient presents to clinic today for follow-up of anxiety and depression.  At last visit patient was switched from Lexapro to Trintellix as he was having substantial sexual side effects from the Lexapro.  Patient endorses taking medication as directed and notes tolerating very well, without any noted side effect.  He notes this does help some with his anxiety but is noticing occasional breakthrough symptoms.  No recurrence of sexual side effect.  Sexual function back to normal..   Past Medical History:  Diagnosis Date  . Acid reflux   . History of chicken pox   . Lyme disease    Unknown    Current Outpatient Medications on File Prior to Visit  Medication Sig Dispense Refill  . amphetamine-dextroamphetamine (ADDERALL) 30 MG tablet Take 30 mg by mouth daily.    Marland Kitchen azelastine (ASTELIN) 0.1 % nasal spray U 1 SPR IEN BID    . fluticasone (FLONASE) 50 MCG/ACT nasal spray SHAKE LQ AND U 1 TO 2 SPRAYS IEN QD    . gabapentin (NEURONTIN) 300 MG capsule Take 300 mg by mouth 3 (three) times daily.    Marland Kitchen levocetirizine (XYZAL) 5 MG tablet TK 1 T PO QD IN THE EVE    . omeprazole (PRILOSEC) 20 MG capsule take 1 capsule by mouth once daily 30 capsule 3  . vortioxetine HBr (TRINTELLIX) 20 MG TABS tablet Take 1 tablet (20 mg total) by mouth daily. 30 tablet 2   No current facility-administered medications on file prior to visit.    No Known Allergies  Family History  Problem Relation Age of Onset  . Healthy Mother        Living  . Healthy Father        Living  . Lung cancer Maternal Grandfather   . Throat cancer Maternal Grandfather   . Cancer Maternal Uncle   . Healthy Brother        x3  . Heart attack Brother     Social History   Socioeconomic History  . Marital status: Single    Spouse name: Not on file  . Number of children: Not on file  . Years of education: Not on file  . Highest education level: Not on file  Occupational History  . Not on file  Tobacco Use  . Smoking status:  Former Smoker    Types: Cigarettes  . Smokeless tobacco: Never Used  . Tobacco comment: Quit >5 yrs  Vaping Use  . Vaping Use: Never used  Substance and Sexual Activity  . Alcohol use: Yes    Alcohol/week: 0.0 standard drinks    Comment: rare  . Drug use: No  . Sexual activity: Yes  Other Topics Concern  . Not on file  Social History Narrative  . Not on file   Social Determinants of Health   Financial Resource Strain: Not on file  Food Insecurity: Not on file  Transportation Needs: Not on file  Physical Activity: Not on file  Stress: Not on file  Social Connections: Not on file    Review of Systems - See HPI.  All other ROS are negative.  BP 120/82   Pulse 80   Temp 98 F (36.7 C) (Temporal)   Resp 14   Ht '6\' 3"'  (1.905 m)   Wt 214 lb (97.1 kg)   SpO2 99%   BMI 26.75 kg/m   Physical Exam Constitutional:      Appearance: Normal appearance.  HENT:     Head: Normocephalic and atraumatic.  Cardiovascular:     Rate and Rhythm: Normal rate and regular rhythm.     Pulses: Normal pulses.     Heart sounds: Normal heart sounds.  Pulmonary:     Effort: Pulmonary effort is normal.  Musculoskeletal:     Cervical back: Neck supple.  Neurological:     General: No focal deficit present.     Mental Status: He is alert.  Psychiatric:        Mood and Affect: Mood normal.     Recent Results (from the past 2160 hour(s))  Basic metabolic panel     Status: None   Collection Time: 04/19/20 10:19 AM  Result Value Ref Range   Glucose 82 65 - 99 mg/dL   BUN 10 6 - 20 mg/dL   Creatinine, Ser 0.89 0.76 - 1.27 mg/dL   GFR calc non Af Amer 113 >59 mL/min/1.73   GFR calc Af Amer 131 >59 mL/min/1.73    Comment: **In accordance with recommendations from the NKF-ASN Task force,**   [facility] is in the process of updating its eGFR calculation to the   2021 CKD-EPI creatinine equation that estimates kidney function   without a race variable.    BUN/Creatinine Ratio 11 9 - 20    Sodium 140 134 - 144 mmol/L   Potassium 4.5 3.5 - 5.2 mmol/L   Chloride 104 96 - 106 mmol/L   CO2 25 20 - 29 mmol/L   Calcium 9.3 8.7 - 10.2 mg/dL  CBC     Status: None   Collection Time: 04/19/20 10:19 AM  Result Value Ref Range   WBC 5.1 3.4 - 10.8 x10E3/uL   RBC 4.82 4.14 - 5.80 x10E6/uL   Hemoglobin 14.8 13.0 - 17.7 g/dL   Hematocrit 42.9 37.5 - 51.0 %   MCV 89 79 - 97 fL   MCH 30.7 26.6 - 33.0 pg   MCHC 34.5 31.5 - 35.7 g/dL   RDW 11.7 11.6 - 15.4 %   Platelets 191 150 - 450 x10E3/uL  C-reactive protein     Status: None   Collection Time: 04/19/20 10:19 AM  Result Value Ref Range   CRP <1 0 - 10 mg/L  Sedimentation rate     Status: None   Collection Time: 04/19/20 10:19 AM  Result Value Ref Range   Sed Rate 2 0 - 15 mm/hr  SARS CORONAVIRUS 2 (TAT 6-24 HRS) Nasopharyngeal Nasopharyngeal Swab     Status: None   Collection Time: 05/08/20  1:55 PM   Specimen: Nasopharyngeal Swab  Result Value Ref Range   SARS Coronavirus 2 NEGATIVE NEGATIVE    Comment: (NOTE) SARS-CoV-2 target nucleic acids are NOT DETECTED.  The SARS-CoV-2 RNA is generally detectable in upper and lower respiratory specimens during the acute phase of infection. Negative results do not preclude SARS-CoV-2 infection, do not rule out co-infections with other pathogens, and should not be used as the sole basis for treatment or other patient management decisions. Negative results must be combined with clinical observations, patient history, and epidemiological information. The expected result is Negative.  Fact Sheet for Patients: SugarRoll.be  Fact Sheet for Healthcare Providers: https://www.woods-mathews.com/  This test is not yet approved or cleared by the [place] FDA and  has been authorized for detection and/or diagnosis of SARS-CoV-2 by FDA under an Emergency Use Authorization (EUA). This EUA will remain  in effect (meaning this test can be used) for  the duration of the COVID-19 declaration under Se ction 564(b)(1) of the Act,  21 U.S.C. section 360bbb-3(b)(1), unless the authorization is terminated or revoked sooner.  Performed at [facility], [place] [address] [place], [place] [address]   ECHOCARDIOGRAM COMPLETE     Status: None   Collection Time: 05/11/20  8:01 AM  Result Value Ref Range   AR max vel 4.33 cm2   AV Peak grad 3.1 mmHg   Ao pk vel 0.89 m/s   S' Lateral 3.00 cm   Area-P 1/2 3.28 cm2   AV Area VTI 4.33 cm2   AV Mean grad 2.0 mmHg   Single Plane A4C EF 59.2 %   Single Plane A2C EF 59.0 %   Calc EF 59.5 %   AV Area mean vel 4.15 cm2  EXERCISE TOLERANCE TEST (ETT)     Status: None   Collection Time: 05/12/20  4:23 PM  Result Value Ref Range   Rest HR 81 bpm   Rest BP 136/89 mmHg   Exercise duration (sec) 18 sec   Percent HR 97 %   Exercise duration (min) 12 min   Estimated workload 13.9 METS   Peak HR 184 bpm   Peak BP 185/79 mmHg   MPHR 188 bpm    Assessment/Plan: 1. Depression, recurrent ([place]) 2. PTSD (post-traumatic stress disorder) Tolerating Trintellix much better.  Is helping quite a bit with symptoms but occasional breakthrough symptoms.  Discussed options including given the Trintellix a few more weeks to potentially recheck more robust effect, having to the Trintellix or switching to a different medication.  He would like to remain on the Trintellix at current dose and reassess in a couple of weeks.  He is to send a MyChart message to let us know how he is doing.  This visit occurred during the SARS-CoV-2 public health emergency.  Safety protocols were in place, including screening questions prior to the visit, additional usage of staff PPE, and extensive cleaning of exam room while observing appropriate contact time as indicated for disinfecting solutions.     Leeanne Rio, PA-C

## 2020-06-01 NOTE — Patient Instructions (Signed)
Please continue the Trintellix. As discussed, if you do notice further improvement by the time the current bottle is almost out, let me know and we can switch to an alternative medication.   I am glad things checked out good with your Cardiologist.  Follow-up with them as scheduled.   Ok to proceed with your COVID vaccination.

## 2020-06-06 NOTE — Progress Notes (Deleted)
Cardiology Office Note:    Date:  06/06/2020   ID:  Joshua Fry, DOB 1985-07-17, MRN [id number]  PCP:  Joshua Jeans, PA-C  Cardiologist:  No primary care provider on file.  Electrophysiologist:  None   Referring MD: Joshua Jeans, PA-C   No chief complaint on file.   History of Present Illness:    Joshua Fry is a 35 y.o. male with a hx of GERD who presents for follow-up.  He was referred by Joshua Noble, PA for evaluation of chest pain, initially seen on 04/19/2020.  He reports that he has been having sharp pains in his chest that started about 1 month ago.  Occurs in the bottom of part of his chest sometimes at the time of his chest.  Describes as sharp stabbing pain that typically last for couple minutes.  Does note that certain positions can make it worse, though has not noted any relationship with exertion or deep breathing.  Has not exercised for the last 9 months, most exertion he does his yard work.  Has been having episodes 3-4 times per day.  Denies any dyspnea, lightheadedness, syncope, lower extremity edema.  Denies any palpitations but did report during one episode of chest pain felt like his heart was going fast.  No smoking history  Brother had SCD at age 73.  Occurred while playing basketball.  Reports was told work-up unremarkable.    Echocardiogram on 05/11/2020 showed normal biventricular function, no significant valvular disease.  ETT on 05/12/2020 showed excellent exercise capacity (14 METS), no evidence of ischemia.  Past Medical History:  Diagnosis Date  . Acid reflux   . History of chicken pox   . Lyme disease    Unknown    Past Surgical History:  Procedure Laterality Date  . ORIF FACIAL FRACTURE     Left Cheek  . WISDOM TOOTH EXTRACTION      Current Medications: No outpatient medications have been marked as taking for the 06/09/20 encounter (Appointment) with Donato Heinz, MD.     Allergies:   Patient has no known  allergies.   Social History   Socioeconomic History  . Marital status: Single    Spouse name: Not on file  . Number of children: Not on file  . Years of education: Not on file  . Highest education level: Not on file  Occupational History  . Not on file  Tobacco Use  . Smoking status: Former Smoker    Types: Cigarettes  . Smokeless tobacco: Never Used  . Tobacco comment: Quit >5 yrs  Vaping Use  . Vaping Use: Never used  Substance and Sexual Activity  . Alcohol use: Yes    Alcohol/week: 0.0 standard drinks    Comment: rare  . Drug use: No  . Sexual activity: Yes  Other Topics Concern  . Not on file  Social History Narrative  . Not on file   Social Determinants of Health   Financial Resource Strain: Not on file  Food Insecurity: Not on file  Transportation Needs: Not on file  Physical Activity: Not on file  Stress: Not on file  Social Connections: Not on file     Family History: The patient's family history includes Cancer in his maternal uncle; Healthy in his brother, father, and mother; Heart attack in his brother; Lung cancer in his maternal grandfather; Throat cancer in his maternal grandfather.  ROS:   Please see the history of present illness.     All  other systems reviewed and are negative.  EKGs/Labs/Other Studies Reviewed:    The following studies were reviewed today:   EKG:  EKG is  ordered today.  The ekg ordered today demonstrates normal sinus rhythm, no ST abnormalities  Recent Labs: 04/19/2020: BUN 10; Creatinine, Ser 0.89; Hemoglobin 14.8; Platelets 191; Potassium 4.5; Sodium 140  Recent Lipid Panel    Component Value Date/Time   CHOL 191 12/10/2018 1543   TRIG 102.0 12/10/2018 1543   HDL 55.60 12/10/2018 1543   CHOLHDL 3 12/10/2018 1543   VLDL 20.4 12/10/2018 1543   LDLCALC 115 (H) 12/10/2018 1543    Physical Exam:    VS:  There were no vitals taken for this visit.    Wt Readings from Last 3 Encounters:  06/01/20 214 lb (97.1 kg)   04/19/20 215 lb 3.2 oz (97.6 kg)  04/05/20 210 lb (95.3 kg)     GEN:  Well nourished, well developed in no acute distress HEENT: Normal NECK: No JVD; No carotid bruits LYMPHATICS: No lymphadenopathy CARDIAC: RRR, no murmurs, rubs, gallops RESPIRATORY:  Clear to auscultation without rales, wheezing or rhonchi  ABDOMEN: Soft, non-tender, non-distended MUSCULOSKELETAL:  No edema; No deformity  SKIN: Warm and dry NEUROLOGIC:  Alert and oriented x 3 PSYCHIATRIC:  Normal affect   ASSESSMENT:    No diagnosis found. PLAN:    Chest pain: Atypical in description, but given his family history (brother with sudden cardiac death age 72), recommend further evaluation.  ESR/CRP negative.  Echocardiogram on 05/11/2020 showed normal biventricular function, no significant valvular disease.  ETT on 05/12/2020 showed excellent exercise capacity (14 METS), no evidence of ischemia.  RTC in ***   Medication Adjustments/Labs and Tests Ordered: Current medicines are reviewed at length with the patient today.  Concerns regarding medicines are outlined above.  No orders of the defined types were placed in this encounter.  No orders of the defined types were placed in this encounter.   There are no Patient Instructions on file for this visit.   Signed, Donato Heinz, MD  06/06/2020 3:27 PM    [place]

## 2020-06-09 ENCOUNTER — Ambulatory Visit: Admitting: Cardiology

## 2020-06-09 ENCOUNTER — Encounter: Payer: Self-pay | Admitting: Physician Assistant

## 2020-06-11 ENCOUNTER — Other Ambulatory Visit: Payer: Self-pay

## 2020-06-11 ENCOUNTER — Encounter: Payer: Self-pay | Admitting: Physician Assistant

## 2020-06-11 ENCOUNTER — Telehealth (INDEPENDENT_AMBULATORY_CARE_PROVIDER_SITE_OTHER): Admitting: Physician Assistant

## 2020-06-11 DIAGNOSIS — U071 COVID-19: Secondary | ICD-10-CM

## 2020-06-11 NOTE — Patient Instructions (Signed)
Instructions sent to patients MyChart.

## 2020-06-11 NOTE — Progress Notes (Signed)
   Virtual Visit via Video   I connected with patient on 06/11/20 at 11:30 AM EST by a video enabled telemedicine application and verified that I am speaking with the correct person using two identifiers.  Location patient: Home Location provider: [facility] [facility], [facility] Persons participating in the virtual visit: Patient, Provider, CMA (Patina Moore)  I discussed the limitations of evaluation and management by telemedicine and the availability of in person appointments. The patient expressed understanding and agreed to proceed.  Subjective:   HPI:   Patient presents via Care[facility] today to discuss recent + COVID test and current symptoms. Notes Monday night starting with fever and subsequent development of cough that is mainly dry but sometimes productive of clear sputum, nasal congestion, chest tightness, body aches and fatigue. No fever since yesterday. Denies ear pain, tooth pain or sinus pain. Has been taking Tylenol every 4-6 hours up until yesterday. Is keeping well-hydrated.    ROS:   See pertinent positives and negatives per HPI.  Patient Active Problem List   Diagnosis Date Noted  . PTSD (post-traumatic stress disorder) 04/08/2020  . 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 Depression, recurrent (HCC) 04/08/2020  . Decreased libido 03/14/2017  . Seasonal allergic rhinitis 10/10/2016  . Back pain, lumbosacral 07/21/2015  . Epididymal cyst 07/17/2014  . GERD (gastroesophageal reflux disease) 07/17/2014    Social History   Tobacco Use  . Smoking status: Former Smoker    Types: Cigarettes  . Smokeless tobacco: Never Used  . Tobacco comment: Quit >5 yrs  Substance Use Topics  . Alcohol use: Yes    Alcohol/week: 0.0 standard drinks    Comment: rare    Current Outpatient Medications:  .  amphetamine-dextroamphetamine (ADDERALL) 30 MG tablet, Take 30 mg by mouth daily., Disp: , Rfl:  .  azelastine (ASTELIN) 0.1 % nasal spray, U 1 SPR IEN BID, Disp: , Rfl:  .   fluticasone (FLONASE) 50 MCG/ACT nasal spray, SHAKE LQ AND U 1 TO 2 SPRAYS IEN QD, Disp: , Rfl:  .  gabapentin (NEURONTIN) 300 MG capsule, Take 300 mg by mouth 3 (three) times daily., Disp: , Rfl:  .  levocetirizine (XYZAL) 5 MG tablet, TK 1 T PO QD IN THE EVE, Disp: , Rfl:  .  omeprazole (PRILOSEC) 20 MG capsule, take 1 capsule by mouth once daily, Disp: 30 capsule, Rfl: 3 .  vortioxetine HBr (TRINTELLIX) 20 MG TABS tablet, Take 1 tablet (20 mg total) by mouth daily., Disp: 30 tablet, Rfl: 2  No Known Allergies  Objective:   There were no vitals taken for this visit.  Patient is well-developed, well-nourished in no acute distress.  Resting comfortably at home.  Head is normocephalic, atraumatic.  No labored breathing.  Speech is clear and coherent with logical content.  Patient is alert and oriented at baseline.   Assessment and Plan:   1. COVID-19 Mild and improving symptoms.  Fever has resolved.  Continue hydration and rest.  Start saline nasal rinse.  Mucinex DM recommended.  Vitamin regimen reviewed with patient.  He is to start as directed.  Rx albuterol HFA to have on file at the pharmacy if needed.  Patient enrolled in COVID symptom monitoring program through MyChart.  Quarantine reviewed.  Strict return precautions discussed with patient.    Piedad Climes, PA-C 06/11/2020

## 2020-06-11 NOTE — Progress Notes (Signed)
I have discussed the procedure for the virtual visit with the patient who has given consent to proceed with assessment and treatment.   Alyana Kreiter S Raksha Wolfgang, CMA     

## 2020-06-27 NOTE — Progress Notes (Signed)
Cardiology Office Note:    Date:  06/28/2020   ID:  AMERICO VALLERY, DOB [birth year], MRN [id number]  PCP:  Brunetta Jeans, PA-C  Cardiologist:  No primary care provider on file.  Electrophysiologist:  None   Referring MD: Brunetta Jeans, PA-C   Chief Complaint  Patient presents with  . Chest Pain    History of Present Illness:    Joshua Fry is a 35 y.o. male with a hx of GERD who presents for follow-up.  He was referred by Raiford Noble, PA for evaluation of chest pain, initially seen on 04/19/2020.  He reports that he has been having sharp pains in his chest that started about 1 month ago.  Occurs in the bottom of part of his chest sometimes at the time of his chest.  Describes as sharp stabbing pain that typically last for couple minutes.  Does note that certain positions can make it worse, though has not noted any relationship with exertion or deep breathing.  Has not exercised for the last 9 months, most exertion he does his yard work.  Has been having episodes 3-4 times per day.  Denies any dyspnea, lightheadedness, syncope, lower extremity edema.  Denies any palpitations but did report during one episode of chest pain felt like his heart was going fast.  No smoking history  Brother has congenital pulmonary stenosis and had SCD at age 41.  Occurred while playing basketball.  Reports was told work-up unremarkable.  Echocardiogram on 05/11/2020 showed normal biventricular function, no significant valvular disease.  ETT on 05/12/2020 showed excellent exercise capacity (14 METS), no evidence of ischemia.  Since last clinic visit, he reports that he is doing well.  Reports chest pain has improved significantly.  Was occurring every day, now occurring about once per week, lasts for few seconds and resolves.  States that he has been exercising by doing yoga and walking on treadmill.  Denies any lightheadedness, syncope, lower extremity edema, or palpitations.    Past  Medical History:  Diagnosis Date  . Acid reflux   . History of chicken pox   . Lyme disease    Unknown    Past Surgical History:  Procedure Laterality Date  . ORIF FACIAL FRACTURE     Left Cheek  . WISDOM TOOTH EXTRACTION      Current Medications: Current Meds  Medication Sig  . amphetamine-dextroamphetamine (ADDERALL) 30 MG tablet Take 30 mg by mouth daily.  Marland Kitchen azelastine (ASTELIN) 0.1 % nasal spray U 1 SPR IEN BID  . fluticasone (FLONASE) 50 MCG/ACT nasal spray SHAKE LQ AND U 1 TO 2 SPRAYS IEN QD  . gabapentin (NEURONTIN) 300 MG capsule Take 300 mg by mouth 3 (three) times daily.  Marland Kitchen levocetirizine (XYZAL) 5 MG tablet TK 1 T PO QD IN THE EVE  . omeprazole (PRILOSEC) 20 MG capsule take 1 capsule by mouth once daily  . vortioxetine HBr (TRINTELLIX) 20 MG TABS tablet Take 1 tablet (20 mg total) by mouth daily.     Allergies:   Patient has no known allergies.   Social History   Socioeconomic History  . Marital status: Single    Spouse name: Not on file  . Number of children: Not on file  . Years of education: Not on file  . Highest education level: Not on file  Occupational History  . Not on file  Tobacco Use  . Smoking status: Former Smoker    Types: Cigarettes  . Smokeless tobacco: Never Used  .  Tobacco comment: Quit >5 yrs  Vaping Use  . Vaping Use: Never used  Substance and Sexual Activity  . Alcohol use: Yes    Alcohol/week: 0.0 standard drinks    Comment: rare  . Drug use: No  . Sexual activity: Yes  Other Topics Concern  . Not on file  Social History Narrative  . Not on file   Social Determinants of Health   Financial Resource Strain: Not on file  Food Insecurity: Not on file  Transportation Needs: Not on file  Physical Activity: Not on file  Stress: Not on file  Social Connections: Not on file     Family History: The patient's family history includes Cancer in his maternal uncle; Healthy in his brother, father, and mother; Heart attack in his  brother; Lung cancer in his maternal grandfather; Throat cancer in his maternal grandfather.  ROS:   Please see the history of present illness.     All other systems reviewed and are negative.  EKGs/Labs/Other Studies Reviewed:    The following studies were reviewed today:   EKG:  EKG is  ordered today.  The ekg ordered today demonstrates normal sinus rhythm, no ST abnormalities  Recent Labs: 04/19/2020: BUN 10; Creatinine, Ser 0.89; Hemoglobin 14.8; Platelets 191; Potassium 4.5; Sodium 140  Recent Lipid Panel    Component Value Date/Time   CHOL 191 12/10/2018 1543   TRIG 102.0 12/10/2018 1543   HDL 55.60 12/10/2018 1543   CHOLHDL 3 12/10/2018 1543   VLDL 20.4 12/10/2018 1543   LDLCALC 115 (H) 12/10/2018 1543    Physical Exam:    VS:  BP 138/82   Pulse 86   Ht '6\' 3"'  (1.905 m)   Wt 214 lb 3.2 oz (97.2 kg)   BMI 26.77 kg/m     Wt Readings from Last 3 Encounters:  06/28/20 214 lb 3.2 oz (97.2 kg)  06/01/20 214 lb (97.1 kg)  04/19/20 215 lb 3.2 oz (97.6 kg)     GEN:  Well nourished, well developed in no acute distress HEENT: Normal NECK: No JVD CARDIAC: RRR, no murmurs, rubs, gallops RESPIRATORY:  Clear to auscultation without rales, wheezing or rhonchi  ABDOMEN: Soft, non-tender, non-distended MUSCULOSKELETAL:  No edema; No deformity  SKIN: Warm and dry NEUROLOGIC:  Alert and oriented x 3 PSYCHIATRIC:  Normal affect   ASSESSMENT:    1. Chest pain of uncertain etiology   2. OSA (obstructive sleep apnea)   3. Family history of sudden cardiac death    PLAN:    Chest pain: Atypical in description, but given his family history (brother with sudden cardiac death age 55), recommended further evaluation.  ESR/CRP negative.  Echocardiogram on 05/11/2020 showed normal biventricular function, no significant valvular disease.  ETT on 05/12/2020 showed excellent exercise capacity (14 METS), no evidence of ischemia. -Reports chest pain has improved, now occurring about  once per week and lasting few seconds.  Suspect noncardiac chest pain  OSA: recent positive sleep study at [place], will f/u with sleep medicine  Family history sudden cardiac death: Brother has history of congenital pulmonic stenosis and sudden cardiac death while playing basketball at age 79.  Patient denies any history of syncopal episodes.  Echocardiogram and ETT unremarkable as above  RTC in 1 year   Medication Adjustments/Labs and Tests Ordered: Current medicines are reviewed at length with the patient today.  Concerns regarding medicines are outlined above.  No orders of the defined types were placed in this encounter.  No orders of  the defined types were placed in this encounter.   Patient Instructions  Medication Instructions:  Your physician recommends that you continue on your current medications as directed. Please refer to the Current Medication list given to you today.  *If you need a refill on your cardiac medications before your next appointment, please call your pharmacy*  Follow-Up: At [facility], you and your health needs are our priority.  As part of our continuing mission to provide you with exceptional heart care, we have created designated Provider Care Teams.  These Care Teams include your primary Cardiologist (physician) and Advanced Practice Providers (APPs -  Physician Assistants and Nurse Practitioners) who all work together to provide you with the care you need, when you need it.  We recommend signing up for the patient portal called "MyChart".  Sign up information is provided on this After Visit Summary.  MyChart is used to connect with patients for Virtual Visits (Telemedicine).  Patients are able to view lab/test results, encounter notes, upcoming appointments, etc.  Non-urgent messages can be sent to your provider as well.   To learn more about what you can do with MyChart, go to [email or web address].    Your next appointment:   12 month(s)  The  format for your next appointment:   In Person  Provider:   Oswaldo Milian, MD        Signed, Joshua Heinz, MD  06/28/2020 9:23 AM    [place]

## 2020-06-28 ENCOUNTER — Ambulatory Visit (INDEPENDENT_AMBULATORY_CARE_PROVIDER_SITE_OTHER): Admitting: Cardiology

## 2020-06-28 ENCOUNTER — Other Ambulatory Visit: Payer: Self-pay

## 2020-06-28 ENCOUNTER — Encounter: Payer: Self-pay | Admitting: Cardiology

## 2020-06-28 VITALS — BP 138/82 | HR 86 | Ht 75.0 in | Wt 214.2 lb

## 2020-06-28 DIAGNOSIS — R079 Chest pain, unspecified: Secondary | ICD-10-CM

## 2020-06-28 DIAGNOSIS — Z8241 Family history of sudden cardiac death: Secondary | ICD-10-CM

## 2020-06-28 DIAGNOSIS — G4733 Obstructive sleep apnea (adult) (pediatric): Secondary | ICD-10-CM

## 2020-06-28 NOTE — Patient Instructions (Signed)

## 2020-07-05 ENCOUNTER — Encounter: Payer: Self-pay | Admitting: Physician Assistant

## 2020-07-15 ENCOUNTER — Ambulatory Visit (INDEPENDENT_AMBULATORY_CARE_PROVIDER_SITE_OTHER): Admitting: Physician Assistant

## 2020-07-15 ENCOUNTER — Other Ambulatory Visit: Payer: Self-pay

## 2020-07-15 ENCOUNTER — Encounter: Payer: Self-pay | Admitting: Physician Assistant

## 2020-07-15 VITALS — BP 130/82 | HR 96 | Temp 97.6°F | Ht 75.0 in | Wt 209.0 lb

## 2020-07-15 DIAGNOSIS — F339 Major depressive disorder, recurrent, unspecified: Secondary | ICD-10-CM

## 2020-07-15 DIAGNOSIS — K219 Gastro-esophageal reflux disease without esophagitis: Secondary | ICD-10-CM

## 2020-07-15 DIAGNOSIS — F900 Attention-deficit hyperactivity disorder, predominantly inattentive type: Secondary | ICD-10-CM | POA: Diagnosis not present

## 2020-07-15 DIAGNOSIS — F4310 Post-traumatic stress disorder, unspecified: Secondary | ICD-10-CM

## 2020-07-15 DIAGNOSIS — G4733 Obstructive sleep apnea (adult) (pediatric): Secondary | ICD-10-CM

## 2020-07-15 DIAGNOSIS — M545 Low back pain, unspecified: Secondary | ICD-10-CM | POA: Diagnosis not present

## 2020-07-15 DIAGNOSIS — J302 Other seasonal allergic rhinitis: Secondary | ICD-10-CM

## 2020-07-15 MED ORDER — VORTIOXETINE HBR 20 MG PO TABS: 20.0000 mg | ORAL_TABLET | Freq: Every day | ORAL | 2 refills | Status: DC

## 2020-07-15 MED ORDER — AMPHETAMINE-DEXTROAMPHET ER 15 MG PO CP24: 15.0000 mg | ORAL_CAPSULE | ORAL | 0 refills | Status: DC

## 2020-07-15 NOTE — Progress Notes (Addendum)
New Patient Office Visit  Subjective:  Patient ID: Joshua Fry, male    DOB: 1986/04/16  Age: 35 y.o. MRN: [id number]  CC:  Chief Complaint  Patient presents with  . Transfer of care    HPI Joshua Fry presents for Transfer of Care from [facility], [place].  In the medical retirement process from the [employer] due to lower back pain, left ankle pain, & PTSD. He has records from the [place] with him today.   Low back pain - states it hurts all the time, but intermittently worse to the point where he cannot walk. CT in 04/2016 showed broad-based disc protrusion at L4-5, and L5-S1. He is a patient of [facility]. He has done several rounds of PT, chiropractics, yoga, with only minimal relief. Currently takes Gabapentin 300 mg TID. Has had Tramadol in the past, but states it makes him very groggy. He is needing an EMG test done.   PTSD - Trintellix 20 mg daily has been doing OK- needs refill. He was on Lexapro previously which he says worked better for his symptoms, but caused sexual dysfunction. He would like to see a new psychiatrist. Counseling has definitely helped him and wants to continue this as well. He does have nightmares and was Rx'd Minipress, but has not tried this medication yet.  Chest pains - Sharp, left sided, went into jaw and neck over the last year. He had work-up with cardiologist, and all tests were negative. Concluded it was probably panic attacks. He was provided Vistaril 25 mg to take TID, but says he was not sure about this medication and hasn't started it yet.   ADHD - diagnosed through [facility]. Currently taking Adderall XR 10 mg once daily, but feels like this is not working as well and is still having focus issues.   Mild sleep apnea diagnosed through the [place] as well. He was not told that he needs a CPAP.   Past Medical History:  Diagnosis Date  . Acid reflux   . History of chicken pox   . Lyme disease    Unknown    Past Surgical  History:  Procedure Laterality Date  . ORIF FACIAL FRACTURE     Left Cheek  . WISDOM TOOTH EXTRACTION      Family History  Problem Relation Age of Onset  . Healthy Mother        Living  . Healthy Father        Living  . Lung cancer Maternal Grandfather   . Throat cancer Maternal Grandfather   . Cancer Maternal Uncle   . Healthy Brother        x3  . Heart attack Brother     Social History   Socioeconomic History  . Marital status: Single    Spouse name: Not on file  . Number of children: Not on file  . Years of education: Not on file  . Highest education level: Not on file  Occupational History  . Not on file  Tobacco Use  . Smoking status: Former Smoker    Types: Cigarettes  . Smokeless tobacco: Never Used  . Tobacco comment: Quit >5 yrs  Vaping Use  . Vaping Use: Never used  Substance and Sexual Activity  . Alcohol use: Yes    Alcohol/week: 0.0 standard drinks    Comment: rare  . Drug use: No  . Sexual activity: Yes  Other Topics Concern  . Not on file  Social History Narrative  .  Not on file   Social Determinants of Health   Financial Resource Strain: Not on file  Food Insecurity: Not on file  Transportation Needs: Not on file  Physical Activity: Not on file  Stress: Not on file  Social Connections: Not on file  Intimate Partner Violence: Not on file    ROS Review of Systems  Constitutional: Negative for activity change, appetite change and unexpected weight change.  HENT: Negative for congestion.   Eyes: Negative for visual disturbance.  Respiratory: Negative for apnea and shortness of breath.   Cardiovascular: Negative for chest pain (intermittent, see HPI).  Gastrointestinal: Negative for abdominal pain and blood in stool.  Endocrine: Negative for polydipsia, polyphagia and polyuria.  Genitourinary: Negative for difficulty urinating.  Musculoskeletal: Positive for back pain. Negative for arthralgias.  Skin: Negative for rash.  Neurological:  Negative for seizures, weakness and headaches.  Psychiatric/Behavioral: Positive for sleep disturbance. Negative for suicidal ideas. The patient is nervous/anxious.     Objective:   Today's Vitals: BP 130/82 (BP Location: Left Arm, Patient Position: Sitting, Cuff Size: Normal)   Pulse 96   Temp 97.6 F (36.4 C) (Temporal)   Ht 6\' 3"  (1.905 m)   Wt 209 lb (94.8 kg)   SpO2 99%   BMI 26.12 kg/m   Physical Exam Vitals and nursing note reviewed.  Constitutional:      General: He is not in acute distress.    Appearance: Normal appearance. He is not toxic-appearing.  HENT:     Head: Normocephalic and atraumatic.     Right Ear: Tympanic membrane, ear canal and external ear normal.     Left Ear: Tympanic membrane, ear canal and external ear normal.     Nose: Nose normal.     Mouth/Throat:     Mouth: Mucous membranes are moist.     Pharynx: Oropharynx is clear.  Eyes:     Extraocular Movements: Extraocular movements intact.     Conjunctiva/sclera: Conjunctivae normal.     Pupils: Pupils are equal, round, and reactive to light.  Cardiovascular:     Rate and Rhythm: Normal rate and regular rhythm.     Pulses: Normal pulses.     Heart sounds: Normal heart sounds.  Pulmonary:     Effort: Pulmonary effort is normal.     Breath sounds: Normal breath sounds.  Abdominal:     General: Abdomen is flat. Bowel sounds are normal.     Palpations: Abdomen is soft.     Tenderness: There is no abdominal tenderness.  Musculoskeletal:        General: Normal range of motion.     Cervical back: Normal range of motion and neck supple.  Skin:    General: Skin is warm and dry.  Neurological:     General: No focal deficit present.     Mental Status: He is alert and oriented to person, place, and time.  Psychiatric:        Mood and Affect: Mood normal.        Behavior: Behavior normal.     Assessment & Plan:   Problem List Items Addressed This Visit      Respiratory   Seasonal allergic  rhinitis     Digestive   GERD (gastroesophageal reflux disease)     Other   Back pain, lumbosacral   PTSD (post-traumatic stress disorder) - Primary   Relevant Medications   hydrOXYzine (VISTARIL) 25 MG capsule   Depression, recurrent (HCC)   Relevant Medications  hydrOXYzine (VISTARIL) 25 MG capsule    Other Visit Diagnoses    ADHD (attention deficit hyperactivity disorder), inattentive type       OSA (obstructive sleep apnea)          Outpatient Encounter Medications as of 07/15/2020  Medication Sig  . amphetamine-dextroamphetamine (ADDERALL) 30 MG tablet Take 30 mg by mouth daily.  Marland Kitchen azelastine (ASTELIN) 0.1 % nasal spray U 1 SPR IEN BID  . fluticasone (FLONASE) 50 MCG/ACT nasal spray SHAKE LQ AND U 1 TO 2 SPRAYS IEN QD  . gabapentin (NEURONTIN) 300 MG capsule Take 300 mg by mouth 3 (three) times daily.  Marland Kitchen levocetirizine (XYZAL) 5 MG tablet TK 1 T PO QD IN THE EVE  . omeprazole (PRILOSEC) 20 MG capsule take 1 capsule by mouth once daily  . vortioxetine HBr (TRINTELLIX) 20 MG TABS tablet Take 1 tablet (20 mg total) by mouth daily.  . hydrOXYzine (VISTARIL) 25 MG capsule TAKE ONE CAPSULE BY MOUTH TWICE A DAY AS NEEDED FOR ANXIETY/ SLEEP (Patient not taking: Reported on 07/15/2020)  . prazosin (MINIPRESS) 1 MG capsule TAKE ONE CAPSULE BY MOUTH AT BEDTIME FOR NIGHTMARES FOR NIGHTMARES (Patient not taking: Reported on 07/15/2020)   No facility-administered encounter medications on file as of 07/15/2020.    Follow-up: Return in about 4 weeks (around 08/12/2020) for med check.   1. PTSD (post-traumatic stress disorder) [facility] He would like to try a new psychiatrist and counseling. I will put a referral in today.  He is still having nightmares and I encouraged him to try the Minipress as directed for this.   2. Depression, recurrent (HCC) He has been taking Trintellix which is working fairly well.  Again he will hopefully see a new psychiatrist to  discuss this further after I sent referral.  No suicidal ideations.  3. Back pain, lumbosacral He is going to continue his conservative care at home as well as revisit [facility] on this issue.  4. ADHD (attention deficit hyperactivity disorder), inattentive type I have increased his Adderall to Adderall XR 15 mg once daily.  I am going to follow-up in 1 month with him.  He will call sooner if any problems. PMPD accessed today. No discrepancies or red flags on review. New contract signed with our office today to start filling this medication.  5. OSA (obstructive sleep apnea) This is mild.  He may want to discuss with orthodontics about a dental device to help with his symptoms.  6. Gastroesophageal reflux disease without esophagitis He is stable on his omeprazole 20 mg daily.  7. Seasonal allergic rhinitis, unspecified trigger He takes Xyzal 5 mg daily as needed.  No issues with this right now.   Alyssa M Allwardt, PA-C

## 2020-07-15 NOTE — Patient Instructions (Addendum)
Great to meet you today!  I will work on a referral to behavioral health for you. Increase Adderall XR to 15 mg daily. I will send in a month's prescription for you. Try the Minipress at bedtime for nightmares.   Call in the meantime if any concerns.

## 2020-08-13 ENCOUNTER — Other Ambulatory Visit: Payer: Self-pay

## 2020-08-13 ENCOUNTER — Encounter: Payer: Self-pay | Admitting: Physician Assistant

## 2020-08-13 ENCOUNTER — Ambulatory Visit (INDEPENDENT_AMBULATORY_CARE_PROVIDER_SITE_OTHER): Admitting: Physician Assistant

## 2020-08-13 VITALS — BP 134/88 | HR 74 | Temp 98.0°F | Ht 75.0 in | Wt 208.2 lb

## 2020-08-13 DIAGNOSIS — F900 Attention-deficit hyperactivity disorder, predominantly inattentive type: Secondary | ICD-10-CM

## 2020-08-13 MED ORDER — AMPHETAMINE-DEXTROAMPHET ER 15 MG PO CP24
15.0000 mg | ORAL_CAPSULE | ORAL | 0 refills | Status: DC
Start: 2020-08-13 — End: 2020-11-25

## 2020-08-13 MED ORDER — AMPHETAMINE-DEXTROAMPHETAMINE 15 MG PO TABS: 15.0000 mg | ORAL_TABLET | Freq: Every day | ORAL | 0 refills | Status: DC

## 2020-08-13 NOTE — Progress Notes (Signed)
Established Patient Office Visit  Subjective:  Patient ID: Joshua Fry, male    DOB: 11/27/1985  Age: 35 y.o. MRN: [id number]  CC:  Chief Complaint  Patient presents with  . Follow-up    HPI Joshua Fry presents for follow up on ADHD. Increased to Adderall XR 15 mg last visit and he states he notices a good improvement, but it starts to wear off around 2 pm. He told me today that he starts his day at 5 am and this is when he normally takes it. 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 Denies any side effects such as change in appetite, chest pain, palpitations, trouble sleeping, etc.   Past Medical History:  Diagnosis Date  . Acid reflux   . History of chicken pox   . Lyme disease    Unknown    Past Surgical History:  Procedure Laterality Date  . ORIF FACIAL FRACTURE     Left Cheek  . WISDOM TOOTH EXTRACTION      Family History  Problem Relation Age of Onset  . Healthy Mother        Living  . Healthy Father        Living  . Lung cancer Maternal Grandfather   . Throat cancer Maternal Grandfather   . Cancer Maternal Uncle   . Healthy Brother        x3  . Heart attack Brother     Social History   Socioeconomic History  . Marital status: Single    Spouse name: Not on file  . Number of children: Not on file  . Years of education: Not on file  . Highest education level: Not on file  Occupational History  . Not on file  Tobacco Use  . Smoking status: Former Smoker    Types: Cigarettes  . Smokeless tobacco: Never Used  . Tobacco comment: Quit >5 yrs  Vaping Use  . Vaping Use: Never used  Substance and Sexual Activity  . Alcohol use: Yes    Alcohol/week: 0.0 standard drinks    Comment: rare  . Drug use: No  . Sexual activity: Yes  Other Topics Concern  . Not on file  Social History Narrative  . Not on file   Social Determinants of Health   Financial Resource Strain: Not on file  Food Insecurity: Not  on file  Transportation Needs: Not on file  Physical Activity: Not on file  Stress: Not on file  Social Connections: Not on file  Intimate Partner Violence: Not on file    Outpatient Medications Prior to Visit  Medication Sig Dispense Refill  . azelastine (ASTELIN) 0.1 % nasal spray U 1 SPR IEN BID    . fluticasone (FLONASE) 50 MCG/ACT nasal spray SHAKE LQ AND U 1 TO 2 SPRAYS IEN QD    . gabapentin (NEURONTIN) 300 MG capsule Take 300 mg by mouth 3 (three) times daily.    . hydrOXYzine (VISTARIL) 25 MG capsule     . levocetirizine (XYZAL) 5 MG tablet TK 1 T PO QD IN THE EVE    . omeprazole (PRILOSEC) 20 MG capsule take 1 capsule by mouth once daily 30 capsule 3  . prazosin (MINIPRESS) 1 MG capsule     . vortioxetine HBr (TRINTELLIX) 20 MG TABS tablet Take 1 tablet (20 mg total) by mouth daily. 30 tablet 2  . amphetamine-dextroamphetamine (ADDERALL XR) 15 MG 24 hr capsule Take 1 capsule by  mouth every morning. 30 capsule 0   No facility-administered medications prior to visit.    No Known Allergies  ROS Review of Systems  Constitutional: Negative for activity change, appetite change and unexpected weight change.  HENT: Negative for congestion.   Eyes: Negative for visual disturbance.  Respiratory: Negative for apnea and shortness of breath.   Cardiovascular: Negative for chest pain and palpitations.  Gastrointestinal: Negative for abdominal pain and blood in stool.  Endocrine: Negative for polydipsia, polyphagia and polyuria.  Genitourinary: Negative for difficulty urinating.  Musculoskeletal: Negative for arthralgias.  Skin: Negative for rash.  Neurological: Negative for seizures, weakness and headaches.  Psychiatric/Behavioral: Positive for decreased concentration. Negative for suicidal ideas.      Objective:    Physical Exam Vitals and nursing note reviewed.  Constitutional:      General: He is not in acute distress.    Appearance: Normal appearance.  Cardiovascular:      Rate and Rhythm: Normal rate and regular rhythm.     Pulses: Normal pulses.     Heart sounds: No murmur heard.   Pulmonary:     Effort: Pulmonary effort is normal. No respiratory distress.     Breath sounds: Normal breath sounds.  Neurological:     General: No focal deficit present.     Mental Status: He is alert.     Cranial Nerves: No cranial nerve deficit.  Psychiatric:        Mood and Affect: Mood normal.        Behavior: Behavior normal.        Thought Content: Thought content normal.        Judgment: Judgment normal.     BP 134/88   Pulse 74   Temp 98 F (36.7 C)   Ht 6\' 3"  (1.905 m)   Wt 208 lb 3.2 oz (94.4 kg)   SpO2 100%   BMI 26.02 kg/m  Wt Readings from Last 3 Encounters:  08/13/20 208 lb 3.2 oz (94.4 kg)  07/15/20 209 lb (94.8 kg)  06/28/20 214 lb 3.2 oz (97.2 kg)     Assessment & Plan:   Problem List Items Addressed This Visit   None   Visit Diagnoses    ADHD (attention deficit hyperactivity disorder), inattentive type    -  Primary      Meds ordered this encounter  Medications  . amphetamine-dextroamphetamine (ADDERALL XR) 15 MG 24 hr capsule    Sig: Take 1 capsule by mouth every morning.    Dispense:  90 capsule    Refill:  0  . amphetamine-dextroamphetamine (ADDERALL) 15 MG tablet    Sig: Take 1 tablet by mouth daily. Take around 2 pm daily.    Dispense:  90 tablet    Refill:  0    Follow-up: Return in about 3 months (around 11/13/2020) for ADHD med check .   1. ADHD (attention deficit hyperactivity disorder), inattentive type Medication management with controlled medication today. Overall doing better with the increase to Adderall XR 15 mg. Will refill this for the next 3 months. Will add on immediate release Adderall 15 mg to take at 2 pm when the XR starts to wear off. Will also refill this for 3 months. Patient will call sooner if any concerns come up. Side effects, risks vs benefits discussed.  This visit occurred during the  SARS-CoV-2 public health emergency.  Safety protocols were in place, including screening questions prior to the visit, additional usage of staff PPE, and extensive cleaning  of exam room while observing appropriate contact time as indicated for disinfecting solutions.    Shanan Mcmiller M Breella Vanostrand, PA-C

## 2020-08-13 NOTE — Patient Instructions (Signed)
Good to see you again today. Please continue the Adderall XR 15 mg when you have been taking it around 5 am. Take the Adderall 15 mg immediate release around 2 pm. See you back in 3 months for recheck.  Call sooner if any concerns.

## 2020-09-12 ENCOUNTER — Other Ambulatory Visit: Payer: Self-pay | Admitting: Physician Assistant

## 2020-11-19 ENCOUNTER — Ambulatory Visit: Admitting: Family Medicine

## 2020-11-25 ENCOUNTER — Other Ambulatory Visit: Payer: Self-pay

## 2020-11-25 ENCOUNTER — Ambulatory Visit (INDEPENDENT_AMBULATORY_CARE_PROVIDER_SITE_OTHER): Admitting: Physician Assistant

## 2020-11-25 VITALS — BP 129/89 | HR 96 | Temp 97.6°F | Ht 75.0 in | Wt 206.2 lb

## 2020-11-25 DIAGNOSIS — F4310 Post-traumatic stress disorder, unspecified: Secondary | ICD-10-CM

## 2020-11-25 DIAGNOSIS — F900 Attention-deficit hyperactivity disorder, predominantly inattentive type: Secondary | ICD-10-CM | POA: Diagnosis not present

## 2020-11-25 DIAGNOSIS — G4733 Obstructive sleep apnea (adult) (pediatric): Secondary | ICD-10-CM | POA: Diagnosis not present

## 2020-11-25 MED ORDER — AMPHETAMINE-DEXTROAMPHETAMINE 15 MG PO TABS: 15.0000 mg | ORAL_TABLET | Freq: Every day | ORAL | 0 refills | Status: DC

## 2020-11-25 MED ORDER — AMPHETAMINE-DEXTROAMPHET ER 15 MG PO CP24: 15.0000 mg | ORAL_CAPSULE | ORAL | 0 refills | Status: DC

## 2020-11-25 NOTE — Patient Instructions (Addendum)
Good to see you again today. I sent refills of your Adderall to the pharmacy. Please bring a copy of your sleep study results or send through MyChart.  Complete paperwork for [facility] and submit accordingly.  Call if any concerns, see you back in three months.

## 2020-11-25 NOTE — Progress Notes (Signed)
Established Patient Office Visit  Subjective:  Patient ID: Joshua Fry, male    DOB: [birth year]  Age: 35 y.o. MRN: [id number]  CC:  Chief Complaint  Patient presents with   ADHD    HPI Joshua Fry presents for ADHD follow up. He feels stable on current dosing regimen and feels that the short acting Adderall in the afternoon has been very helpful. Denies any problems with the meds.   Retired from [employer] since last visit. He has not been seeing behavioral health through [place] any longer. He does want to see a different counselor.   He has also been sleeping "terribly." [facility] patient and he says it was probably mild sleep apnea. Could not get a CPAP machine due to back-order. He does snore as well. Usually has groggy effects from sleep medications the following day, so he tries to stay away from these. He was last taking Minipress for nightmares.   Denies any chest pain or palpitations. No SOB. No dizziness. Appetite is fine per patient. No other complaints.   Past Medical History:  Diagnosis Date   Acid reflux    History of chicken pox    Lyme disease    Unknown    Past Surgical History:  Procedure Laterality Date   ORIF FACIAL FRACTURE     Left Cheek   WISDOM TOOTH EXTRACTION      Family History  Problem Relation Age of Onset   Healthy Mother        Living   Healthy Father        Living   Lung cancer Maternal Grandfather    Throat cancer Maternal Grandfather    Cancer Maternal Uncle    Healthy Brother        x3   Heart attack Brother     Social History   Socioeconomic History   Marital status: Single    Spouse name: Not on file   Number of children: Not on file   Years of education: Not on file   Highest education level: Not on file  Occupational History   Not on file  Tobacco Use   Smoking status: Former    Pack years: 0.00    Types: Cigarettes   Smokeless tobacco: Never   Tobacco comments:    Quit >5 yrs   Vaping Use   Vaping Use: Never used  Substance and Sexual Activity   Alcohol use: Yes    Alcohol/week: 0.0 standard drinks    Comment: rare   Drug use: No   Sexual activity: Yes  Other Topics Concern   Not on file  Social History Narrative   Not on file   Social Determinants of Health   Financial Resource Strain: Not on file  Food Insecurity: Not on file  Transportation Needs: Not on file  Physical Activity: Not on file  Stress: Not on file  Social Connections: Not on file  Intimate Partner Violence: Not on file    Outpatient Medications Prior to Visit  Medication Sig Dispense Refill   amphetamine-dextroamphetamine (ADDERALL XR) 15 MG 24 hr capsule Take 1 capsule by mouth every morning. 90 capsule 0   amphetamine-dextroamphetamine (ADDERALL) 15 MG tablet Take 1 tablet by mouth daily. Take around 2 pm daily. 90 tablet 0   azelastine (ASTELIN) 0.1 % nasal spray U 1 SPR IEN BID     fluticasone (FLONASE) 50 MCG/ACT nasal spray SHAKE LQ AND U 1 TO 2 SPRAYS  IEN QD     gabapentin (NEURONTIN) 300 MG capsule Take 300 mg by mouth 3 (three) times daily.     omeprazole (PRILOSEC) 20 MG capsule take 1 capsule by mouth once daily 30 capsule 3   prazosin (MINIPRESS) 1 MG capsule      TRINTELLIX 20 MG TABS tablet TAKE 1 TABLET(20 MG) BY MOUTH DAILY 30 tablet 2   hydrOXYzine (VISTARIL) 25 MG capsule      levocetirizine (XYZAL) 5 MG tablet TK 1 T PO QD IN THE EVE     No facility-administered medications prior to visit.    No Known Allergies  ROS Review of Systems REFER TO HPI FOR PERTINENT POSITIVES AND NEGATIVES     Objective:    Physical Exam Vitals and nursing note reviewed.  Constitutional:      General: He is not in acute distress.    Appearance: Normal appearance.  Cardiovascular:     Rate and Rhythm: Normal rate and regular rhythm.     Pulses: Normal pulses.     Heart sounds: No murmur heard. Pulmonary:     Effort: Pulmonary effort is normal. No respiratory  distress.     Breath sounds: Normal breath sounds.  Neurological:     General: No focal deficit present.     Mental Status: He is alert.     Cranial Nerves: No cranial nerve deficit.  Psychiatric:        Mood and Affect: Mood normal.        Behavior: Behavior normal.        Thought Content: Thought content normal.        Judgment: Judgment normal.    BP 129/89   Pulse 96   Temp 97.6 F (36.4 C)   Ht 6\' 3"  (1.905 m)   Wt 206 lb 3.2 oz (93.5 kg)   SpO2 100%   BMI 25.77 kg/m  Wt Readings from Last 3 Encounters:  11/25/20 206 lb 3.2 oz (93.5 kg)  08/13/20 208 lb 3.2 oz (94.4 kg)  07/15/20 209 lb (94.8 kg)     Health Maintenance Due  Topic Date Due   Hepatitis C Screening  Never done   TETANUS/TDAP  10/27/2020   COVID-19 Vaccine (3 - Booster for Moderna series) 11/28/2020    There are no preventive care reminders to display for this patient.  Lab Results  Component Value Date   TSH 1.70 07/17/2014   Lab Results  Component Value Date   WBC 5.1 04/19/2020   HGB 14.8 04/19/2020   HCT 42.9 04/19/2020   MCV 89 04/19/2020   PLT 191 04/19/2020   Lab Results  Component Value Date   NA 140 04/19/2020   K 4.5 04/19/2020   CO2 25 04/19/2020   GLUCOSE 82 04/19/2020   BUN 10 04/19/2020   CREATININE 0.89 04/19/2020   BILITOT 0.8 12/10/2018   ALKPHOS 64 12/10/2018   AST 22 12/10/2018   ALT 21 12/10/2018   PROT 7.0 12/10/2018   ALBUMIN 4.9 12/10/2018   CALCIUM 9.3 04/19/2020   GFR 83.12 12/10/2018   Lab Results  Component Value Date   CHOL 191 12/10/2018   Lab Results  Component Value Date   HDL 55.60 12/10/2018   Lab Results  Component Value Date   LDLCALC 115 (H) 12/10/2018   Lab Results  Component Value Date   TRIG 102.0 12/10/2018   Lab Results  Component Value Date   CHOLHDL 3 12/10/2018   Lab Results  Component Value Date  HGBA1C 5.2 12/10/2018      Assessment & Plan:   Problem List Items Addressed This Visit   None   No orders of  the defined types were placed in this encounter.   Follow-up: No follow-ups on file.   1. ADHD (attention deficit hyperactivity disorder), inattentive type Currently stable on Adderall XR 15 mg once daily and Adderall 15 mg IR once daily around 2 pm. PDMP reviewed and no red flags. Rx's refilled today.  2. PTSD (post-traumatic stress disorder) No longer seeing the [place]. He is still taking the Trintellix 20 mg. Notes it does not work as well as Lexapro, but he could not tolerate side effects from this medication. He has paperwork at home for new behavioral health and will complete this to get an appointment. No SI.  Flowsheet Row Office Visit from 11/25/2020 in [facility]  PHQ-9 Total Score 15       3. OSA (obstructive sleep apnea) Need results - likely mild per patient. Once we have sleep study results, we can go forward with ordering for CPAP.  Total encounter time today with patient, reviewing medications and deciding on plan, plus documentation was 25 minutes.  Ravin Bendall M Stamatia Masri, PA-C

## 2021-01-07 ENCOUNTER — Encounter: Payer: Self-pay | Admitting: Physician Assistant

## 2021-02-18 ENCOUNTER — Ambulatory Visit (INDEPENDENT_AMBULATORY_CARE_PROVIDER_SITE_OTHER): Admitting: Behavioral Health

## 2021-02-18 ENCOUNTER — Encounter: Payer: Self-pay | Admitting: Behavioral Health

## 2021-02-18 ENCOUNTER — Other Ambulatory Visit: Payer: Self-pay

## 2021-02-18 VITALS — BP 136/86 | HR 77 | Ht 74.0 in | Wt 203.0 lb

## 2021-02-18 DIAGNOSIS — F331 Major depressive disorder, recurrent, moderate: Secondary | ICD-10-CM | POA: Diagnosis not present

## 2021-02-18 DIAGNOSIS — F902 Attention-deficit hyperactivity disorder, combined type: Secondary | ICD-10-CM

## 2021-02-18 DIAGNOSIS — F411 Generalized anxiety disorder: Secondary | ICD-10-CM | POA: Diagnosis not present

## 2021-02-18 DIAGNOSIS — F4310 Post-traumatic stress disorder, unspecified: Secondary | ICD-10-CM

## 2021-02-18 MED ORDER — BUSPIRONE HCL 15 MG PO TABS: ORAL_TABLET | ORAL | 1 refills | Status: DC

## 2021-02-18 NOTE — Progress Notes (Signed)
[facility] MD/PA/NP Initial Note  02/18/2021 11:04 AM Joshua Fry  MRN:  [id number]  Chief Complaint:  Chief Complaint   Anxiety; Trauma; Depression; Establish Care; Medication Problem     HPI:  35 year old male presents to this office for initial visit and to establish care. He is calm, pleasant, logical and engaged.  He said that he was receiving care for the [facility] for  PTSD, depression, anxiety and ADHD. 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 He said he was medically retired [employer] in in May of 2022. He is receiving 90% disability at this time. Says that he felt like he wanted to establish care for mental health outside the [place] system.  Says that he has suffered from PTSD related illness since 2015 and has tried multiple medications. He currently works as a Emergency planning/management officer and says that his current medication have not been working well enough to control his anxiety. He reports anxiety today at 6/10 and depression 5/10. He sleeps 7-8 hours per night and currently is prescribed Prazosin for nightmares. He endorses times of irritability, heightened energy, racing thoughts, and lack of concentration. He does not want to make any major changes to his current medication regimen prior to coming to this office but he is open to adjunct medication to help with anxiety. He says that he has not had great success with medications thus far. He denies mania, no psychosis. Denies auditory or visual hallucinations. He feels safe and denies SI/HI. He does follow up with PCP regularly.   Past psychiatric medication trials: Zoloft Lexapro Trintellix       Visit Diagnosis:    ICD-10-CM   1. PTSD (post-traumatic stress disorder)  F43.10     2. Generalized anxiety disorder  F41.1     3. Major depressive disorder, recurrent episode, moderate (HCC)  F33.1       Past Psychiatric History:   Past Medical History:  Past Medical History:  Diagnosis Date   Acid reflux     History of chicken pox    Lyme disease    Unknown    Past Surgical History:  Procedure Laterality Date   ORIF FACIAL FRACTURE     Left Cheek   WISDOM TOOTH EXTRACTION      Family Psychiatric History: none noted this visit  Family History:  Family History  Problem Relation Age of Onset   Healthy Mother        Living   Healthy Father        Living   Lung cancer Maternal Grandfather    Throat cancer Maternal Grandfather    Cancer Maternal Uncle    Healthy Brother        x3   Heart attack Brother     Social History:  Social History   Socioeconomic History   Marital status: Single    Spouse name: Not on file   Number of children: Not on file   Years of education: Not on file   Highest education level: Not on file  Occupational History   Not on file  Tobacco Use   Smoking status: Former    Types: Cigarettes   Smokeless tobacco: Never   Tobacco comments:    Quit >5 yrs  Vaping Use   Vaping Use: Never used  Substance and Sexual Activity   Alcohol use: Yes    Alcohol/week: 0.0 standard drinks    Comment: rare   Drug use: No   Sexual activity: Yes  Other Topics Concern   Not on file  Social History Narrative   Not on file   Social Determinants of Health   Financial Resource Strain: Not on file  Food Insecurity: Not on file  Transportation Needs: Not on file  Physical Activity: Not on file  Stress: Not on file  Social Connections: Not on file    Allergies: No Known Allergies  Metabolic Disorder Labs: Lab Results  Component Value Date   HGBA1C 5.2 12/10/2018   No results found for: PROLACTIN Lab Results  Component Value Date   CHOL 191 12/10/2018   TRIG 102.0 12/10/2018   HDL 55.60 12/10/2018   CHOLHDL 3 12/10/2018   VLDL 20.4 12/10/2018   LDLCALC 115 (H) 12/10/2018   Lab Results  Component Value Date   TSH 1.70 07/17/2014    Therapeutic Level Labs: No results found for: LITHIUM No results found for: VALPROATE No components found for:   CBMZ  Current Medications: Current Outpatient Medications  Medication Sig Dispense Refill   amphetamine-dextroamphetamine (ADDERALL XR) 15 MG 24 hr capsule Take 1 capsule by mouth every morning. 90 capsule 0   amphetamine-dextroamphetamine (ADDERALL) 15 MG tablet Take 1 tablet by mouth daily. Take around 2 pm daily. 90 tablet 0   azelastine (ASTELIN) 0.1 % nasal spray U 1 SPR IEN BID     fluticasone (FLONASE) 50 MCG/ACT nasal spray SHAKE LQ AND U 1 TO 2 SPRAYS IEN QD     gabapentin (NEURONTIN) 300 MG capsule Take 300 mg by mouth 3 (three) times daily.     omeprazole (PRILOSEC) 20 MG capsule take 1 capsule by mouth once daily 30 capsule 3   prazosin (MINIPRESS) 1 MG capsule      TRINTELLIX 20 MG TABS tablet TAKE 1 TABLET(20 MG) BY MOUTH DAILY 30 tablet 2   No current facility-administered medications for this visit.    Medication Side Effects: none  Orders placed this visit:  No orders of the defined types were placed in this encounter.   Psychiatric Specialty Exam:  Review of Systems  HENT:  Positive for tinnitus.   Musculoskeletal:  Positive for back pain and joint swelling.  Allergic/Immunologic: Positive for environmental allergies.   Blood pressure 136/86, pulse 77, height 6\' 2"  (1.88 m), weight 203 lb (92.1 kg).Body mass index is 26.06 kg/m.  General Appearance:  Neat, clean, well groomed  Eye Contact:  Good  Speech:  Clear and Coherent  Volume:  Normal  Mood:  NA  Affect:  Appropriate  Thought Process:  Coherent  Orientation:  Full (Time, Place, and Person)  Thought Content: Logical   Suicidal Thoughts:  No  Homicidal Thoughts:  No  Memory:  WNL  Judgement:  Good  Insight:  Good  Psychomotor Activity:  Normal  Concentration:  Concentration: Good  Recall:  Good  Fund of Knowledge: Good  Language: Good  Assets:  Desire for Improvement  ADL's:  Intact  Cognition: WNL  Prognosis:  Good   Screenings:  GAD-7    Flowsheet Row Office Visit from 02/18/2021 in  [facility]  Total GAD-7 Score 18      PHQ2-9    Flowsheet Row Office Visit from 02/18/2021 in [facility] Office Visit from 11/25/2020 in [facility] from 04/05/2020 in [facility] Office Visit from 12/10/2018 in [facility] Office Visit from 10/17/2016 in [facility]  PHQ-2 Total Score 4 3 0 0 0  PHQ-9 Total Score 13 15 -- 0 0       Receiving Psychotherapy: No   Treatment Plan/Recommendations:  Greater than 50% of 60 min face to face time with patient was spent on counseling and coordination of care. 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 Discussed etiology of symptoms stemming from traumatic events. We discussed various medication options and alternatives. He was concerned about psychotropic medications causing sexual side effects.We agreed on the following:  To continue Trintellix 20 mg daily To continue Prazosin 1 mg capsule at bedtime To continue Gabapentin 300 mg three times daily.   Patient agrees to trial of BuSpar.  Will start BuSpar 15 mg 1/3 tablet twice daily for 1 week, then increase to 2/3 tablet twice daily for 1 week, then increase to 1 tablet twice daily for anxiety.  Continue Adderall 15 mg XR in the am daily Continue Adderall 15 mg around 2 pm daily for boost. Will report any side effects or worsening symptoms promptly Will follow up in 4 weeks to reassess Provided emergency contact information Discussed potential benefits, risks, and side effects of stimulants with patient to include increased heart rate, palpitations, insomnia, increased anxiety, increased irritability, or decreased appetite.  Instructed patient to contact office if experiencing any significant tolerability issues.  Reviewed PDMP    Joan Flores, NP

## 2021-02-20 ENCOUNTER — Encounter: Payer: Self-pay | Admitting: Behavioral Health

## 2021-02-25 ENCOUNTER — Ambulatory Visit: Admitting: Physician Assistant

## 2021-02-28 ENCOUNTER — Encounter: Payer: Self-pay | Admitting: Physician Assistant

## 2021-03-24 ENCOUNTER — Other Ambulatory Visit: Payer: Self-pay

## 2021-03-24 ENCOUNTER — Ambulatory Visit (INDEPENDENT_AMBULATORY_CARE_PROVIDER_SITE_OTHER): Admitting: Mental Health

## 2021-03-24 DIAGNOSIS — F4310 Post-traumatic stress disorder, unspecified: Secondary | ICD-10-CM

## 2021-03-24 NOTE — Progress Notes (Signed)
[facility] Counselor Initial Adult Exam  Name: Joshua Fry Date: 03/24/2021 MRN: [id number] DOB: Oct 24, 1985 PCP: Bary Leriche, PA-C  Time spent: 53 minutes  Reason for Visit /Presenting Problem: patient reports he has been at the [facility] for treatment for years. He has been in therapy for years intermittently. Some aspects of therapy He sometimes dreads coming to therapy "I keep things locked away in my brain", but a few days later if felt more helpful. The therapist he liked, one in particular,  was a good listener.  Dx'd w/ chronic PTSD, ADHD. 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  He served 14 years from 2008 to 2022- medically retired. 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  He is closed off emotionally from his parents, "everyone but my wife". He withholds to not let them know the "battles in my head". Wants to do a better job of visiting them. He works in Secretary/administrator. Reports night terrors, has panic attacks (chest pain) often after leaving work going home. Stated when work gets stressful, his attacks increase, feels it on the way home. For a few months he began to use alcohol during the week to try and calm his nerves, now uses cannabis at times. One of his recurrent nightmares has returned.  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  He stated that his unit was often to search for IED's.  He shared some contents from one of the missions that relates to the recurrent nightmare. Recommended he continue to see Avelina Laine, NP at our practice for continued medication management and return to therapy in 2 weeks.   Mental Status Exam:    Appearance:    Casual     Behavior:   Appropriate  Motor:   WNL  Speech/Language:    Clear and Coherent  Affect:   Full range   Mood:   Anxious, pleasant  Thought process:   Logical, linear, goal directed  Thought content:     WNL  Sensory/Perceptual disturbances:     none  Orientation:   x4  Attention:   Good  Concentration:    Good  Memory:   Intact  Fund of knowledge:    Consistent with age and development  Insight:     Good  Judgment:    Good  Impulse Control:   Good     Reported Symptoms:  sleep problems (night terrors)  Risk Assessment: Danger to Self:  No Self-injurious Behavior: No Danger to Others: No Duty to Warn:no Physical Aggression / Violence:No  Access to Firearms a concern: No  Gang Involvement:No  Patient / guardian was educated about steps to take if suicide or homicide risk level increases between visits: yes While future psychiatric events cannot be accurately predicted, the patient does not currently require acute inpatient psychiatric care and does not currently meet [facility] involuntary commitment criteria.  Substance Abuse History: Current substance abuse: etoh and cannabis use-recreational  Past Psychiatric History:   Previous psychological history is significant for ADHD and PTSD Outpatient Providers:[facility] History of Psych Hospitalization: No  Psychological Testing:  none   Family History:  Family History  Problem Relation Age of Onset   Healthy Mother        Living   Healthy Father        Living   Lung cancer Maternal Grandfather    Throat cancer Maternal Grandfather    Cancer Maternal Uncle    Healthy Brother  x3   Heart attack Brother    IncMedical History/Surgical History: Past Medical History:  Diagnosis Date   Acid reflux    History of chicken pox    Lyme disease    Unknown    Past Surgical History:  Procedure Laterality Date   ORIF FACIAL FRACTURE     Left Cheek   WISDOM TOOTH EXTRACTION      Medications: Current Outpatient Medications  Medication Sig Dispense Refill   amphetamine-dextroamphetamine (ADDERALL XR) 15 MG 24 hr capsule Take 1 capsule by mouth every morning. 90 capsule 0   amphetamine-dextroamphetamine (ADDERALL) 15 MG tablet Take 1 tablet by mouth daily. Take around 2 pm daily. 90 tablet 0   azelastine (ASTELIN) 0.1 %  nasal spray U 1 SPR IEN BID     busPIRone (BUSPAR) 15 MG tablet Take 1/3 tablet p.o. twice daily for 1 week, then take 2/3 tablet p.o. twice daily for 1 week, then take 1 tablet p.o. twice daily 60 tablet 1   fluticasone (FLONASE) 50 MCG/ACT nasal spray SHAKE LQ AND U 1 TO 2 SPRAYS IEN QD     gabapentin (NEURONTIN) 300 MG capsule Take 300 mg by mouth 3 (three) times daily.     omeprazole (PRILOSEC) 20 MG capsule take 1 capsule by mouth once daily 30 capsule 3   prazosin (MINIPRESS) 1 MG capsule      TRINTELLIX 20 MG TABS tablet TAKE 1 TABLET(20 MG) BY MOUTH DAILY 30 tablet 2   No current facility-administered medications for this visit.    No Known Allergies  Diagnoses:    ICD-10-CM   1. PTSD (post-traumatic stress disorder)  F43.10       Plan of Care: TBD   [name] Session, [facility]

## 2021-04-04 ENCOUNTER — Other Ambulatory Visit: Payer: Self-pay

## 2021-04-04 ENCOUNTER — Ambulatory Visit (INDEPENDENT_AMBULATORY_CARE_PROVIDER_SITE_OTHER): Admitting: Mental Health

## 2021-04-04 DIAGNOSIS — F4310 Post-traumatic stress disorder, unspecified: Secondary | ICD-10-CM | POA: Diagnosis not present

## 2021-04-04 NOTE — Plan of Care (Signed)
Error

## 2021-04-04 NOTE — Progress Notes (Addendum)
[facility] Counselor Initial Adult Exam  Name: Joshua Fry Date: 04/04/2021 MRN: [id number] DOB: Jan 06, 1986 PCP: Bary Leriche, PA-C  Time spent: 50 minutes  Treatment:  individual therapy   Mental Status Exam:    Appearance:    Casual     Behavior:   Appropriate  Motor:   WNL  Speech/Language:    Clear and Coherent  Affect:   Full range   Mood:   Anxious, pleasant  Thought process:   Logical, linear, goal directed  Thought content:     WNL  Sensory/Perceptual disturbances:     none  Orientation:   x4  Attention:   Good  Concentration:   Good  Memory:   Intact  Fund of knowledge:    Consistent with age and development  Insight:     Good  Judgment:    Good  Impulse Control:   Good     Reported Symptoms:  sleep problems (night terrors), irritability, panic attacks, agitation  Risk Assessment: Danger to Self:  No Self-injurious Behavior: No Danger to Others: No Duty to Warn:no Physical Aggression / Violence:No  Access to Firearms a concern: No  Gang Involvement:No  Patient / guardian was educated about steps to take if suicide or homicide risk level increases between visits: yes While future psychiatric events cannot be accurately predicted, the patient does not currently require acute inpatient psychiatric care and does not currently meet [facility] involuntary commitment criteria.  Substance Abuse History: Current substance abuse: etoh and cannabis use-recreational  Past Psychiatric History:   Previous psychological history is significant for ADHD and PTSD Outpatient Providers:[facility] History of Psych Hospitalization: No  Psychological Testing:  none   Family History:  Family History  Problem Relation Age of Onset   Healthy Mother        Living   Healthy Father        Living   Lung cancer Maternal Grandfather    Throat cancer Maternal Grandfather    Cancer Maternal Uncle    Healthy Brother        x3   Heart attack Brother    IncMedical  History/Surgical History: Past Medical History:  Diagnosis Date   Acid reflux    History of chicken pox    Lyme disease    Unknown    Past Surgical History:  Procedure Laterality Date   ORIF FACIAL FRACTURE     Left Cheek   WISDOM TOOTH EXTRACTION      Medications: Current Outpatient Medications  Medication Sig Dispense Refill   amphetamine-dextroamphetamine (ADDERALL XR) 15 MG 24 hr capsule Take 1 capsule by mouth every morning. 90 capsule 0   amphetamine-dextroamphetamine (ADDERALL) 15 MG tablet Take 1 tablet by mouth daily. Take around 2 pm daily. 90 tablet 0   azelastine (ASTELIN) 0.1 % nasal spray U 1 SPR IEN BID     busPIRone (BUSPAR) 15 MG tablet Take 1/3 tablet p.o. twice daily for 1 week, then take 2/3 tablet p.o. twice daily for 1 week, then take 1 tablet p.o. twice daily 60 tablet 1   fluticasone (FLONASE) 50 MCG/ACT nasal spray SHAKE LQ AND U 1 TO 2 SPRAYS IEN QD     gabapentin (NEURONTIN) 300 MG capsule Take 300 mg by mouth 3 (three) times daily.     omeprazole (PRILOSEC) 20 MG capsule take 1 capsule by mouth once daily 30 capsule 3   prazosin (MINIPRESS) 1 MG capsule      TRINTELLIX 20 MG TABS tablet TAKE 1  TABLET(20 MG) BY MOUTH DAILY 30 tablet 2   No current facility-administered medications for this visit.    Abuse History: Victim: none Report needed: no Perpetrator of abuse: no Witness / Exposure to Domestic Violence:  none Protective Services Involvement: no Witness to [employer] Violence:  no   Family / Social History:    Living situation: Lives w/ wife Dorene Grebe) Sexual Orientation: hetero Relationship Status:  married x 3 years  Name of spouse / other: Dorene Grebe If a parent, number of children / ages:   none  Support Systems: family, wife  Surveyor, quantity Stress:  none  Income/Employment/Disability: full time    Financial planner: yes, [employer]   Any cultural differences that may affect / interfere with treatment:  none  Stressors:  chronicity of  sx's  Strengths:  support system  Barriers: none  Legal History: none  Pending legal issue / charges: none  History of legal issue / charges: none    Subjective: Patient arrived on time for today's session.  Completed part to the assessment with patient continuing to gather relevant information, history.  Patient shared how he had a "blow up" last week.  He stated that it was directed toward his boss, going on to share details.  Patient shared an outline of his job history with his current company which has been for about 2 years.  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  He stated that his boss, albeit has some experience in sales in the industry, lacks some experience related to Building control surveyor.  Patient stated that over the period of the 2 years, managing different projects, he has had the challenge of problem solving issues in part due to the lack of experience from others that work with the company.  Patient identified not wanting to get his upset as he knows this can affect his goals toward obtaining other positions in the company in the future.  Facilitated his identifying thoughts regarding how he would have rather to handle the situation as well as ones potentially in the future.  Interventions: Further assessment, motivational interviewing       Diagnoses:    ICD-10-CM   1. PTSD (post-traumatic stress disorder)  F43.10         Plan: Patient is to use CBT, mindfulness and coping skills to help manage decrease symptoms. Patient is to increase her self-confidence, sense of independence and improve her ability to function and maintain in social settings.   Long-term goal:  Reduce overall level, frequency, and intensity of the feelings of depression, anxiety and panic so that daily functioning is not impaired. Short-term goal: To identify and process feelings related to the disappointment of past painful  events that increase worthless feelings.                   Verbally express understanding of the relationship between depressed mood and repression of feelings - such as      anger, hurt, and sadness.                              Verbalize an understanding of the role that distorted thinking plays in creating fears, excessive worry, and ruminations.  Assessment of progress:  progressing     [name] Session, [facility]

## 2021-04-18 ENCOUNTER — Ambulatory Visit (INDEPENDENT_AMBULATORY_CARE_PROVIDER_SITE_OTHER): Admitting: Mental Health

## 2021-04-18 ENCOUNTER — Other Ambulatory Visit: Payer: Self-pay

## 2021-04-18 DIAGNOSIS — F4310 Post-traumatic stress disorder, unspecified: Secondary | ICD-10-CM

## 2021-04-18 NOTE — Progress Notes (Signed)
[facility] Counselor   Name: Joshua Fry Date: 04/18/21 MRN: [id number] DOB: [birth year] PCP: Bary Leriche, PA-C  Time spent: 51 minutes  Treatment:  individual therapy   Mental Status Exam:    Appearance:    Casual     Behavior:   Appropriate  Motor:   WNL  Speech/Language:    Clear and Coherent  Affect:   Full range   Mood:   Anxious, pleasant  Thought process:   Logical, linear, goal directed  Thought content:     WNL  Sensory/Perceptual disturbances:     none  Orientation:   x4  Attention:   Good  Concentration:   Good  Memory:   Intact  Fund of knowledge:    Consistent with age and development  Insight:     Good  Judgment:    Good  Impulse Control:   Good     Reported Symptoms:  sleep problems (night terrors), irritability, panic attacks, agitation  Risk Assessment: Danger to Self:  No Self-injurious Behavior: No Danger to Others: No Duty to Warn:no Physical Aggression / Violence:No  Access to Firearms a concern: No  Gang Involvement:No  Patient / guardian was educated about steps to take if suicide or homicide risk level increases between visits: yes While future psychiatric events cannot be accurately predicted, the patient does not currently require acute inpatient psychiatric care and does not currently meet [facility] involuntary commitment criteria.  Subjective: Patient arrived on time for today's session.  Patient shared recent progress, some less stress at work recently.  Identifies difficulty relaxing.  Stated that when he gets home he may try and play video games to "unwind".  Reports his history of treatment primarily through the [place].  He stated that he learned about "blast brain".  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  He stated that he can get angry quickly, how this has been evident from his time in the [employer] and  since.  Reports chronic back pain as well.  We discussed coping, diaphragmatic breathing while integrating mindfulness strategies.  This was discussed with detail in session and patient plans to utilize between visits. Patient also shared how one of his unit members and close friends have been coping with depression and suicidality last August.  Patient stated he reached out and was able to get him back to his house where they stayed up into the morning hours.  Patient stated he had hired his friend to work at his company, however, the friend quit the job after a few months.  Patient stated that he feels that his friend was embarrassed although patient stated he reassured him that he was not upset about his departure.  Patient stated that he reaches out to him about every 1 to 2 weeks but has not heard from him over the last 2 months.  Patient plans to continue to reach out and provide support if needed.   Interventions: Further assessment, motivational interviewing   Diagnoses:    ICD-10-CM   1. PTSD (post-traumatic stress disorder)  F43.10          Plan: Patient is to use CBT, mindfulness and coping skills to help manage decrease symptoms. Patient is to increase self-confidence, sense of independence and improve her ability to function and maintain in social settings.   Long-term goal:  Reduce overall level, frequency,  and intensity of the feelings of depression, anxiety and panic so that daily functioning is not impaired.  Short-term goal: To identify and process feelings related to the disappointment of past painful events                    Improve ability to relax by utilizing coping skills as discussed        Identify and utilize emotional regulatory skills    Assessment of progress:  progressing     [name] Session, [facility]

## 2021-05-03 ENCOUNTER — Ambulatory Visit: Admitting: Mental Health

## 2021-05-07 ENCOUNTER — Other Ambulatory Visit: Payer: Self-pay | Admitting: Behavioral Health

## 2021-05-07 DIAGNOSIS — F331 Major depressive disorder, recurrent, moderate: Secondary | ICD-10-CM

## 2021-05-07 DIAGNOSIS — F411 Generalized anxiety disorder: Secondary | ICD-10-CM

## 2021-05-07 DIAGNOSIS — F4310 Post-traumatic stress disorder, unspecified: Secondary | ICD-10-CM

## 2021-05-09 NOTE — Telephone Encounter (Signed)
Left message for pt to call and schedule

## 2021-05-09 NOTE — Telephone Encounter (Signed)
Please schedule appt

## 2021-05-26 ENCOUNTER — Ambulatory Visit: Admitting: Behavioral Health

## 2021-06-02 ENCOUNTER — Ambulatory Visit (INDEPENDENT_AMBULATORY_CARE_PROVIDER_SITE_OTHER): Admitting: Mental Health

## 2021-06-02 ENCOUNTER — Other Ambulatory Visit: Payer: Self-pay

## 2021-06-02 DIAGNOSIS — F4310 Post-traumatic stress disorder, unspecified: Secondary | ICD-10-CM

## 2021-06-02 NOTE — Progress Notes (Signed)
[facility]   Name: Joshua Fry Date: 06/02/21 MRN: [id number] DOB: 16-Jan-1986 PCP: Fredirick Lathe, PA-C  Time spent: 52 minutes  Treatment:  individual therapy   Mental Status Exam:    Appearance:    Casual     Behavior:   Appropriate  Motor:   WNL  Speech/Language:    Clear and Coherent  Affect:   Full range   Mood:   Anxious, pleasant  Thought process:   Logical, linear, goal directed  Thought content:     WNL  Sensory/Perceptual disturbances:     none  Orientation:   x4  Attention:   Good  Concentration:   Good  Memory:   Intact  Fund of knowledge:    Consistent with age and development  Insight:     Good  Judgment:    Good  Impulse Control:   Good     Reported Symptoms:  sleep problems (night terrors), irritability, panic attacks, agitation  Risk Assessment: Danger to Self:  No Self-injurious Behavior: No Danger to Others: No Duty to Warn:no Physical Aggression / Violence:No  Access to Firearms a concern: No  Gang Involvement:No  Patient / guardian was educated about steps to take if suicide or homicide risk level increases between visits: yes While future psychiatric events cannot be accurately predicted, the patient does not currently require acute inpatient psychiatric care and does not currently meet [facility] involuntary commitment criteria.  Subjective: Patient arrived on time for today's session.  Patient shared recent events.  He stated that he was with a friend earlier today he needed his help.  Patient went on to share how this friend is also a work Social worker who restarted him this morning expressing suicidal ideation.  Patient stated he went to his home to provide support and currently the friend is with his parents.  He stated that the friend agreed to be evaluated to get the help that he needs.  He stated that the friend has been going on through a lot of marital stress recently.  Patient went on to share how he continues to cope  with levels of anxiety that may occur randomly.  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  Facilitated his identifying thoughts with which he reminds himself of in the situations to cope and manage.  Through guided discovery, he identified how being in war changed him significantly, how he has a tendency to not feel as deeply regardless of the situation being typically distressful to others.  He wants to be more consistent with taking his medication as he may for   Interventions: Further assessment, motivational interviewing   Diagnoses:    ICD-10-CM   1. PTSD (post-traumatic stress disorder)  F43.10           Plan: Patient is to use CBT, mindfulness and coping skills to help manage decrease symptoms. Patient is to increase self-confidence, sense of independence and improve her ability to function and maintain in social settings.   Long-term goal:  Reduce overall level, frequency, and intensity of the feelings of depression, anxiety and panic so that daily functioning is not impaired.  Short-term goal: To identify and process feelings related to the disappointment of past painful events                    Improve  ability to relax by utilizing coping skills as discussed        Identify and utilize emotional regulatory skills    Assessment of progress:  progressing     Anson [place], [facility]

## 2021-06-16 ENCOUNTER — Encounter: Payer: Self-pay | Admitting: Behavioral Health

## 2021-06-16 ENCOUNTER — Ambulatory Visit (INDEPENDENT_AMBULATORY_CARE_PROVIDER_SITE_OTHER): Admitting: Mental Health

## 2021-06-16 ENCOUNTER — Ambulatory Visit (INDEPENDENT_AMBULATORY_CARE_PROVIDER_SITE_OTHER): Admitting: Behavioral Health

## 2021-06-16 ENCOUNTER — Other Ambulatory Visit: Payer: Self-pay

## 2021-06-16 DIAGNOSIS — F331 Major depressive disorder, recurrent, moderate: Secondary | ICD-10-CM

## 2021-06-16 DIAGNOSIS — F411 Generalized anxiety disorder: Secondary | ICD-10-CM

## 2021-06-16 DIAGNOSIS — F4310 Post-traumatic stress disorder, unspecified: Secondary | ICD-10-CM

## 2021-06-16 DIAGNOSIS — F902 Attention-deficit hyperactivity disorder, combined type: Secondary | ICD-10-CM | POA: Diagnosis not present

## 2021-06-16 MED ORDER — BUSPIRONE HCL 15 MG PO TABS: 15.0000 mg | ORAL_TABLET | Freq: Three times a day (TID) | ORAL | 3 refills | Status: DC

## 2021-06-16 NOTE — Progress Notes (Signed)
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 He says that his depression has been better controlled but he is still having break through anxiety and panic. The anxiety at time happens when he is in the car driving. He would like to adjust his medication appropriately to help with the increase anxiety. He reports that he sometimes forgets to take his medication but agrees to be more attentive in taking as prescribed. He says his anxiety today is 6/10 and depression is 3/10. He is sleeping 7-8 hours but does continue to take Prazosin for night mares. Still endorses periods of irritability and racing thoughts. He does not feel the need to adjust any of his other medications at this time. He denies mania, no auditory or visual hallucinations. Denies SI or HI. [facility] Sees PCP tomorrow.   Past psychiatric medication trials: Zoloft Lexapro Trintellix    Individual Medical History/ Review of Systems: Changes? :No   Allergies: Patient has no known allergies.  Current Medications:  Current Outpatient Medications:    busPIRone (BUSPAR) 15 MG tablet, Take 1 tablet (15 mg total) by mouth 3 (three) times daily., Disp: 90 tablet, Rfl: 3   amphetamine-dextroamphetamine (ADDERALL XR) 15 MG 24 hr capsule, Take 1 capsule by mouth every morning., Disp: 90 capsule, Rfl: 0   amphetamine-dextroamphetamine (ADDERALL) 15 MG tablet, Take 1 tablet by mouth daily. Take around 2 pm daily., Disp: 90 tablet, Rfl: 0   azelastine (ASTELIN) 0.1 % nasal spray, U 1 SPR IEN BID, Disp: , Rfl:    busPIRone (BUSPAR) 15 MG tablet, TAKE 1 TABLET TWICE  DAILY, Disp: 60 tablet, Rfl: 0   fluticasone (FLONASE) 50 MCG/ACT nasal spray, SHAKE LQ AND U 1 TO 2 SPRAYS IEN QD, Disp: , Rfl:    gabapentin (NEURONTIN) 300 MG capsule, Take 300 mg by mouth 3 (three) times daily., Disp: , Rfl:    omeprazole (PRILOSEC) 20 MG capsule, take 1 capsule by mouth once daily, Disp: 30 capsule, Rfl: 3   prazosin (MINIPRESS) 1 MG capsule, , Disp: , Rfl:    TRINTELLIX 20 MG TABS tablet, TAKE 1 TABLET(20 MG) BY MOUTH DAILY, Disp: 30 tablet, Rfl: 2 Medication Side Effects: none  Family Medical/ Social History: Changes? No  MENTAL HEALTH EXAM:  There were no vitals taken for this visit.There is no height or weight on file to calculate BMI.  General Appearance: Casual, Neat, and Well Groomed  Eye Contact:  Good  Speech:  Clear and Coherent  Volume:  Normal  Mood:  NA  Affect:  Appropriate and Congruent  Thought Process:  Coherent  Orientation:  Full (Time, Place, and Person)  Thought Content: Logical   Suicidal Thoughts:  No  Homicidal Thoughts:  No  Memory:  WNL  Judgement:  Good  Insight:  Good  Psychomotor Activity:  Normal  Concentration:  Concentration: Good  Recall:  Good  Fund of Knowledge: Good  Language: Good  Assets:  Desire for Improvement  ADL's:  Intact  Cognition: WNL  Prognosis:  Good    DIAGNOSES:    ICD-10-CM   1. Attention deficit hyperactivity disorder (ADHD), combined type  F90.2  2. PTSD (post-traumatic stress disorder)  F43.10 busPIRone (BUSPAR) 15 MG tablet    3. Generalized anxiety disorder  F41.1 busPIRone (BUSPAR) 15 MG tablet    4. Major depressive disorder, recurrent episode, moderate (HCC)  F33.1 busPIRone (BUSPAR) 15 MG tablet      Receiving Psychotherapy: No    RECOMMENDATIONS:   Greater than 50% of 60 min face to face time with patient was spent on counseling and coordination of care. 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 Discussed his current status with anxiety. His  depression has been more controlled but still having increased anxiety at times especially when driving. We agreed on the following:   To continue Trintellix 20 mg daily To continue Prazosin 1 mg capsule at bedtime To reduce Gabapentin  to 300 mg daily. Pt reports he has been taking this way for some time now.   To increase his Buspar to 15 mg three times daily at least 7-8 hours apart. Educated pt that Buspar is more effective if taking multiple times daily.  Continue Adderall 15 mg XR in the am daily Continue Adderall 15 mg around 2 pm daily for boost. Will report any side effects or worsening symptoms promptly Will follow up in 4 weeks to reassess Provided emergency contact information Discussed potential benefits, risks, and side effects of stimulants with patient to include increased heart rate, palpitations, insomnia, increased anxiety, increased irritability, or decreased appetite.  Instructed patient to contact office if experiencing any significant tolerability issues.  Reviewed PDMP       Joan Flores, NP

## 2021-06-16 NOTE — Progress Notes (Signed)
[facility]   Name: Joshua Fry Date: 06/16/21 MRN: [id number] DOB: Oct 03, 1985 PCP: Bary Leriche, PA-C  Time spent: 51 minutes  Treatment:  individual therapy   Mental Status Exam:    Appearance:    Casual     Behavior:   Appropriate  Motor:   WNL  Speech/Language:    Clear and Coherent  Affect:   Full range   Mood:   Anxious, pleasant  Thought process:   Logical, linear, goal directed  Thought content:     WNL  Sensory/Perceptual disturbances:     none  Orientation:   x4  Attention:   Good  Concentration:   Good  Memory:   Intact  Fund of knowledge:    Consistent with age and development  Insight:     Good  Judgment:    Good  Impulse Control:   Good     Reported Symptoms:  sleep problems (night terrors), irritability, panic attacks, agitation  Risk Assessment: Danger to Self:  No Self-injurious Behavior: No Danger to Others: No Duty to Warn:no Physical Aggression / Violence:No  Access to Firearms a concern: No  Gang Involvement:No  Patient / guardian was educated about steps to take if suicide or homicide risk level increases between visits: yes While future psychiatric events cannot be accurately predicted, the patient does not currently require acute inpatient psychiatric care and does not currently meet [facility] involuntary commitment criteria.  Subjective: Patient arrived on time for today's session.  He shared recent events, progress.  He stated that he continues to have a significant amount of work stress going on to share some details.  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  He went on to share also how one of his friends and employees is currently in rehab for alcohol abuse.  He stated this is the individual we discussed last session he was also having some suicidal thoughts.  He stated that he has concerns about his  readiness to be able to handle his position upon his being discharged going on to share some details.  During session he was notified by this friend via text that he was leaving treatment today and is returning home.  Patient stated he has concerns due to this individual's wife who is also separated from him in the recent weeks.  Patient identified the need to continue to be supportive of him while also being upfront about concerns regarding his return to work at this time.  Also, he identified the need to maintain he plans some interpersonal boundaries with being helpful to others while being mindful of his own needs and ways to cope and care for himself given his identified increase in stress recently.  To be supportive but clear with his communication with his friend as he plans to have a discussion with him over the next few days.     Interventions: Further assessment, motivational interviewing   Diagnoses:    ICD-10-CM   1. PTSD (post-traumatic stress disorder)  F43.10            Plan: Patient is to use CBT, mindfulness and coping skills to help manage decrease symptoms.  Patient to continue to work to recognize what he can control versus what he cannot in the way of trying to be supportive to those in his life and also meeting his own interpersonal needs to keep his stress and  anxiety low.   Long-term goal:  Reduce overall level, frequency, and intensity of the feelings of depression, anxiety and panic so that daily functioning is not impaired.  Short-term goal: To identify and process feelings related to the disappointment of past painful events                    Improve ability to relax by utilizing coping skills as discussed        Identify and utilize emotional regulatory skills    Assessment of progress:  progressing     [name] Session, [facility]

## 2021-06-30 ENCOUNTER — Other Ambulatory Visit: Payer: Self-pay

## 2021-06-30 ENCOUNTER — Ambulatory Visit (INDEPENDENT_AMBULATORY_CARE_PROVIDER_SITE_OTHER): Admitting: Mental Health

## 2021-06-30 DIAGNOSIS — F4310 Post-traumatic stress disorder, unspecified: Secondary | ICD-10-CM | POA: Diagnosis not present

## 2021-06-30 NOTE — Progress Notes (Signed)
[facility]   Name: Joshua Fry Date: 06/30/21 MRN: [id number] DOB: 1985-06-24 PCP: Bary Leriche, PA-C  Time spent: 53 minutes  Treatment:  individual therapy   Mental Status Exam:    Appearance:    Casual     Behavior:   Appropriate  Motor:   WNL  Speech/Language:    Clear and Coherent  Affect:   Full range   Mood:   Anxious, pleasant  Thought process:   Logical, linear, goal directed  Thought content:     WNL  Sensory/Perceptual disturbances:     none  Orientation:   x4  Attention:   Good  Concentration:   Good  Memory:   Intact  Fund of knowledge:    Consistent with age and development  Insight:     Good  Judgment:    Good  Impulse Control:   Good     Reported Symptoms:  sleep problems (night terrors), irritability, panic attacks, agitation  Risk Assessment: Danger to Self:  No Self-injurious Behavior: No Danger to Others: No Duty to Warn:no Physical Aggression / Violence:No  Access to Firearms a concern: No  Gang Involvement:No  Patient / guardian was educated about steps to take if suicide or homicide risk level increases between visits: yes While future psychiatric events cannot be accurately predicted, the patient does not currently require acute inpatient psychiatric care and does not currently meet [facility] involuntary commitment criteria.  Subjective: Patient arrived on time for today's session.  He shared the continued challenges of trying to be supportive of his friend who needed the assistance over the past several weeks. Patient express some concern about his friend's potential ability to fulfill his work duties if he is to return to work. Patient has concern as he did not complete treatment as recommended and returned to this area prematurely. Engaging some problem solving with patient related to his utilizing his resources such as human resources to alleviate his making decisions solely as this he feels will reduce some of his  stress.     Interventions: Further assessment, motivational interviewing   Diagnoses:    ICD-10-CM   1. PTSD (post-traumatic stress disorder)  F43.10         Plan: Patient is to use CBT, mindfulness and coping skills to help manage decrease symptoms.  Patient to continue to work to recognize what he can control versus what he cannot in the way of trying to be supportive to those in his life and also meeting his own interpersonal needs to keep his stress and anxiety low.   Long-term goal:  Reduce overall level, frequency, and intensity of the feelings of depression, anxiety and panic so that daily functioning is not impaired.  Short-term goal: To identify and process feelings related to the disappointment of past painful events                    Improve ability to relax by utilizing coping skills as discussed        Identify and utilize emotional regulatory skills    Assessment of progress:  progressing     [name] Session, [facility]

## 2021-07-14 ENCOUNTER — Ambulatory Visit (INDEPENDENT_AMBULATORY_CARE_PROVIDER_SITE_OTHER): Admitting: Mental Health

## 2021-07-14 ENCOUNTER — Other Ambulatory Visit: Payer: Self-pay

## 2021-07-14 DIAGNOSIS — F4310 Post-traumatic stress disorder, unspecified: Secondary | ICD-10-CM

## 2021-07-20 NOTE — Progress Notes (Signed)
[facility]   Name: Joshua Fry Date: 07/14/21 MRN: [id number] DOB: 1985/07/21 PCP: Bary Leriche, PA-C  Time spent: 50 minutes  Treatment:  individual therapy   Mental Status Exam:    Appearance:    Casual     Behavior:   Appropriate  Motor:   WNL  Speech/Language:    Clear and Coherent  Affect:   Full range   Mood:   Anxious, pleasant  Thought process:   Logical, linear, goal directed  Thought content:     WNL  Sensory/Perceptual disturbances:     none  Orientation:   x4  Attention:   Good  Concentration:   Good  Memory:   Intact  Fund of knowledge:    Consistent with age and development  Insight:     Good  Judgment:    Good  Impulse Control:   Good     Reported Symptoms:  sleep problems (night terrors), irritability, panic attacks, agitation  Risk Assessment: Danger to Self:  No Self-injurious Behavior: No Danger to Others: No Duty to Warn:no Physical Aggression / Violence:No  Access to Firearms a concern: No  Gang Involvement:No  Patient / guardian was educated about steps to take if suicide or homicide risk level increases between visits: yes While future psychiatric events cannot be accurately predicted, the patient does not currently require acute inpatient psychiatric care and does not currently meet [facility] involuntary commitment criteria.  Subjective: Patient arrived on time for today's session. Patient shared the continue challenges of his both personal and work related stress. He stated that his friend, with whom he has been supportive due to recent events, has made decisions that have left patient leading to set a clear boundary related to his work duties as patient consulted with human resources.  He stated that due to the recent events, it was decided that they would have to lay off his friend from working at [employer].  Patient went on to share several reasons why this decision was made as well as how he feels comfortable  with it being the best course of action.  This has put additional stress on patient however, he shared how he is able to be there for others, regardless of how significant the situation may be, including crisis situations.  Patient questioned this about himself, wondering how he is able to engage in situations where he knows others that would have difficulty doing so.  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  Patient acknowledged this as well as went on to share how he feels he is changed, stating that he was significantly different prior to his [employer] service.    Interventions: Further assessment, motivational interviewing   Diagnoses:    ICD-10-CM   1. PTSD (post-traumatic stress disorder)  F43.10          Plan: Patient is to use CBT, mindfulness and coping skills to help manage decrease symptoms.  Patient to continue to work to recognize what he can control versus what he cannot in the way of trying to be supportive to those in his life and also meeting his own interpersonal needs to keep his stress and anxiety low.   Long-term goal:  Reduce overall level, frequency, and intensity of the feelings of depression, anxiety and  panic so that daily functioning is not impaired.  Short-term goal: To identify and process feelings related to the disappointment of past painful events                    Improve ability to relax by utilizing coping skills as discussed        Identify and utilize emotional regulatory skills    Assessment of progress:  progressing     [name] Session, [facility]

## 2021-07-27 ENCOUNTER — Encounter: Payer: Self-pay | Admitting: Behavioral Health

## 2021-07-27 ENCOUNTER — Other Ambulatory Visit: Payer: Self-pay

## 2021-07-27 ENCOUNTER — Ambulatory Visit (INDEPENDENT_AMBULATORY_CARE_PROVIDER_SITE_OTHER): Admitting: Behavioral Health

## 2021-07-27 DIAGNOSIS — F331 Major depressive disorder, recurrent, moderate: Secondary | ICD-10-CM | POA: Diagnosis not present

## 2021-07-27 DIAGNOSIS — F902 Attention-deficit hyperactivity disorder, combined type: Secondary | ICD-10-CM | POA: Diagnosis not present

## 2021-07-27 DIAGNOSIS — F411 Generalized anxiety disorder: Secondary | ICD-10-CM

## 2021-07-27 DIAGNOSIS — F4310 Post-traumatic stress disorder, unspecified: Secondary | ICD-10-CM | POA: Diagnosis not present

## 2021-07-27 MED ORDER — GABAPENTIN 100 MG PO CAPS: 100.0000 mg | ORAL_CAPSULE | Freq: Every day | ORAL | 1 refills | Status: DC

## 2021-07-27 NOTE — Progress Notes (Signed)
[facility] ? ?Patient ID: Joshua Fry,  ?MRN: [id number] ? ?PCP: Allwardt, Crist Infante, PA-C ? ?Date of Evaluation: 07/27/2021 ?Time spent:30 minutes ? ?Chief Complaint:  ?Chief Complaint   ?Anxiety; Depression; Follow-up; Medication Refill; Trauma; ADHD ?  ? ? ?HISTORY/CURRENT STATUS: ?HPI ? 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 He says that his depression has been better controlled but his anxiety is not as controlled while he was on the Lexapro. However, he say he would like to remain on Trintellix for now because of his previous experience with sexual side effects.  He says his anxiety today is 4/10 and depression is 2/10. He is sleeping 7-8 hours but is planning on trying to sleep without taking Prazosin.  Still endorses periods of irritability and racing thoughts. Gets frustrated at other employees that do not share his sense of attention to detail or work ethic as he does. He does not feel the need to adjust any of his other medications at this time. He denies mania, no auditory or visual hallucinations. Denies SI or HI. [facility].  ?  ?Past psychiatric medication trials: ?Zoloft ?Lexapro ?Trintellix  ? ? ?Individual Medical History/ Review of Systems: Changes? :No  ? ?Allergies: Patient has no known allergies. ? ?Current Medications:  ?Current Outpatient Medications:  ?  gabapentin (NEURONTIN) 100 MG capsule, Take 1 capsule (100 mg total) by mouth at bedtime., Disp: 30 capsule, Rfl: 1 ?  amphetamine-dextroamphetamine (ADDERALL XR) 15 MG 24 hr capsule, Take 1 capsule by mouth every morning., Disp: 90 capsule, Rfl: 0 ?  amphetamine-dextroamphetamine (ADDERALL) 15 MG tablet, Take 1 tablet by mouth daily. Take around 2 pm daily., Disp: 90 tablet, Rfl: 0 ?  azelastine (ASTELIN) 0.1 % nasal spray, U 1 SPR IEN BID, Disp: , Rfl:  ?  busPIRone (BUSPAR) 15 MG tablet, TAKE 1 TABLET TWICE DAILY, Disp: 60 tablet, Rfl: 0 ?   busPIRone (BUSPAR) 15 MG tablet, Take 1 tablet (15 mg total) by mouth 3 (three) times daily., Disp: 90 tablet, Rfl: 3 ?  fluticasone (FLONASE) 50 MCG/ACT nasal spray, SHAKE LQ AND U 1 TO 2 SPRAYS IEN QD, Disp: , Rfl:  ?  gabapentin (NEURONTIN) 300 MG capsule, Take 300 mg by mouth 3 (three) times daily., Disp: , Rfl:  ?  omeprazole (PRILOSEC) 20 MG capsule, take 1 capsule by mouth once daily, Disp: 30 capsule, Rfl: 3 ?  prazosin (MINIPRESS) 1 MG capsule, , Disp: , Rfl:  ?  TRINTELLIX 20 MG TABS tablet, TAKE 1 TABLET(20 MG) BY MOUTH DAILY, Disp: 30 tablet, Rfl: 2 ?Medication Side Effects: none ? ?Family Medical/ Social History: Changes? No ? ?MENTAL HEALTH EXAM: ? ?There were no vitals taken for this visit.There is no height or weight on file to calculate BMI.  ?General Appearance: Casual, Neat, and Well Groomed  ?Eye Contact:  Good  ?Speech:  Clear and Coherent  ?Volume:  Normal  ?Mood:  NA  ?Affect:  Appropriate  ?Thought Process:  Coherent  ?Orientation:  Full (Time, Place, and Person)  ?Thought Content: Logical   ?Suicidal Thoughts:  No  ?Homicidal Thoughts:  No  ?Memory:  WNL  ?Judgement:  Good  ?Insight:  Good  ?Psychomotor Activity:  Normal  ?Concentration:  Concentration: Good  ?Recall:  Good  ?Fund of Knowledge: Good  ?Language: Good  ?Assets:  Desire for Improvement  ?ADL's:  Intact  ?Cognition: WNL  ?Prognosis:  Good  ? ? ?  DIAGNOSES:  ?  ICD-10-CM   ?1. PTSD (post-traumatic stress disorder)  F43.10 gabapentin (NEURONTIN) 100 MG capsule  ?  ?2. Generalized anxiety disorder  F41.1 gabapentin (NEURONTIN) 100 MG capsule  ?  ?3. Major depressive disorder, recurrent episode, moderate (HCC)  F33.1 gabapentin (NEURONTIN) 100 MG capsule  ?  ?4. Attention deficit hyperactivity disorder (ADHD), combined type  F90.2   ?  ? ? ?Receiving Psychotherapy: No  ? ? ?RECOMMENDATIONS:  ? ?Greater than 50% of 60 min face to face time with patient was spent on counseling and coordination of care. 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 Discussed his current status with anxiety. We discussed his concerns about what he perceives as many medications. He would like to try to wean off a couple medication that may be having little effect on his care right now. We agreed on the following: ?  ?To continue Trintellix 20 mg daily ?To Stop Prazosin 1 mg capsule at bedtime and will report back how he is doing next visit.  ?To reduce Gabapentin 300 mg by 100 mg  each week until he is taking only 100 mg at bedtime. ?To continue Buspar to 15 mg three times daily at least 7-8 hours apart. Educated pt that Buspar is more effective if taking multiple times daily.  ?Continue Adderall 15 mg XR in the am daily ?Continue Adderall 15 mg around 2 pm daily for boost. ?Will report any side effects or worsening symptoms promptly ?Will follow up in 6 weeks to reassess ?Provided emergency contact information ?Discussed potential benefits, risks, and side effects of stimulants with patient to include increased heart rate, palpitations, insomnia, increased anxiety, increased irritability, or decreased appetite.  Instructed patient to contact office if experiencing any significant tolerability issues.  ?Reviewed PDMP ?  ?  ?  ? ? ? ? ? ?Joan Flores, NP  ?

## 2021-07-29 ENCOUNTER — Other Ambulatory Visit: Payer: Self-pay

## 2021-07-29 ENCOUNTER — Ambulatory Visit (INDEPENDENT_AMBULATORY_CARE_PROVIDER_SITE_OTHER): Admitting: Mental Health

## 2021-07-29 DIAGNOSIS — F4310 Post-traumatic stress disorder, unspecified: Secondary | ICD-10-CM | POA: Diagnosis not present

## 2021-07-29 NOTE — Progress Notes (Incomplete)
[facility]   Name: Joshua Fry Date: 07/29/21 MRN: [id number] DOB: 27-Jan-1986 PCP: Bary Leriche, PA-C  Time spent: 50 minutes  Treatment:  individual therapy   Mental Status Exam:    Appearance:    Casual     Behavior:   Appropriate  Motor:   WNL  Speech/Language:    Clear and Coherent  Affect:   Full range   Mood:   Anxious, pleasant  Thought process:   Logical, linear, goal directed  Thought content:     WNL  Sensory/Perceptual disturbances:     none  Orientation:   x4  Attention:   Good  Concentration:   Good  Memory:   Intact  Fund of knowledge:    Consistent with age and development  Insight:     Good  Judgment:    Good  Impulse Control:   Good     Reported Symptoms:  sleep problems (night terrors), irritability, panic attacks, agitation  Risk Assessment: Danger to Self:  No Self-injurious Behavior: No Danger to Others: No Duty to Warn:no Physical Aggression / Violence:No  Access to Firearms a concern: No  Gang Involvement:No  Patient / guardian was educated about steps to take if suicide or homicide risk level increases between visits: yes While future psychiatric events cannot be accurately predicted, the patient does not currently require acute inpatient psychiatric care and does not currently meet [facility] involuntary commitment criteria.  Subjective: Patient arrived on time for today's session.      Interventions: Further assessment, motivational interviewing   Diagnoses:    ICD-10-CM   1. PTSD (post-traumatic stress disorder)  F43.10           Plan: Patient is to use CBT, mindfulness and coping skills to help manage decrease symptoms.  Patient to continue to work to recognize what he can control versus what he cannot in the way of trying to be supportive to those in his life and also meeting his own interpersonal needs to keep his stress and anxiety low.   Long-term goal:  Reduce overall level, frequency, and  intensity of the feelings of depression, anxiety and panic so that daily functioning is not impaired.  Short-term goal: To identify and process feelings related to the disappointment of past painful events                    Improve ability to relax by utilizing coping skills as discussed        Identify and utilize emotional regulatory skills    Assessment of progress:  progressing     [name] Session, [facility]

## 2021-08-11 ENCOUNTER — Ambulatory Visit (INDEPENDENT_AMBULATORY_CARE_PROVIDER_SITE_OTHER): Admitting: Mental Health

## 2021-08-11 ENCOUNTER — Other Ambulatory Visit: Payer: Self-pay

## 2021-08-11 DIAGNOSIS — F902 Attention-deficit hyperactivity disorder, combined type: Secondary | ICD-10-CM | POA: Diagnosis not present

## 2021-08-11 DIAGNOSIS — F4310 Post-traumatic stress disorder, unspecified: Secondary | ICD-10-CM | POA: Diagnosis not present

## 2021-08-11 NOTE — Progress Notes (Signed)
[facility]  ? ?Name: Joshua Fry ?Date: 08/10/21 ?MRN: [id number] ?DOB: 10-Oct-1985 ?PCP: Allwardt, Randa Evens, PA-C ? ?Time spent: 52 minutes ? ?Treatment:  individual therapy ? ? ?Mental Status Exam: ?   ?Appearance:    Casual     ?Behavior:   Appropriate  ?Motor:   WNL  ?Speech/Language:    Clear and Coherent  ?Affect:   Full range   ?Mood:   Anxious, pleasant  ?Thought process:   Logical, linear, goal directed  ?Thought content:     WNL  ?Sensory/Perceptual disturbances:     none  ?Orientation:   x4  ?Attention:   Good  ?Concentration:   Good  ?Memory:   Intact  ?Fund of knowledge:    Consistent with age and development  ?Insight:     Good  ?Judgment:    Good  ?Impulse Control:   Good  ?  ? ?Reported Symptoms:  sleep problems (night terrors), irritability, panic attacks, agitation ? ?Risk Assessment: ?Danger to Self:  No ?Self-injurious Behavior: No ?Danger to Others: No ?Duty to Warn:no ?Physical Aggression / Violence:No  ?Access to Firearms a concern: No  ?Gang Involvement:No  ?Patient / guardian was educated about steps to take if suicide or homicide risk level increases between visits: yes ?While future psychiatric events cannot be accurately predicted, the patient does not currently require acute inpatient psychiatric care and does not currently meet [facility] involuntary commitment criteria. ? ?Subjective: ?Patient arrived on time for today's session.  He shared recent events, how work has been somewhat less stressful.  He stated that he and his immediate supervisor are getting along well following challenging discussion he had a few weeks ago with him.  He identified ways he plans to continue to be effective and keep stress manageable with his work.  He went on to share other challenges personally, his wife coping with her mother being diagnosed with stage IV cancer.  He stated that at this point, she will probably pass in the next few months.  He stated that she is to undergo some  chemotherapy but the expectation is she is in the terminal phase and treatment will not stop the spread.  He shared more details related to his marital relationship, specifically his wife's relationship with her mother which has been strained for many years.  He identifies how he plans to continue to be supportive, encouraging her to engage in counseling for support and guidance.  He continues to have dreams at night where he is back in [employer] engagement, denies them being upsetting, finds himself recalling throughout the day elements of the dream.  We plan to further discuss next session. ? ? ? ?Interventions: Further assessment, motivational interviewing ? ? ?Diagnoses:  ?  ICD-10-CM   ?1. PTSD (post-traumatic stress disorder)  F43.10   ?  ?2. Attention deficit hyperactivity disorder (ADHD), combined type  F90.2   ?  ? ? ? ? ? ? ? ?Plan: Patient is to use CBT, mindfulness and coping skills to help manage decrease symptoms.  Patient to continue to work to recognize what he can control versus what he cannot in the way of trying to be supportive to those in his life and also meeting his own interpersonal needs to keep his stress and anxiety low. ?  ?Long-term goal:  Reduce overall level, frequency, and intensity of the feelings of depression, anxiety and panic so that daily functioning is not impaired. ? ?Short-term goal: To identify and process feelings related to  the disappointment of past painful events  ?                  Improve ability to relax by utilizing coping skills as discussed ?       Identify and utilize emotional regulatory skills   ? ?Assessment of progress:  progressing   ? ? ?Anson Oregon, [facility]  ? ? ? ?

## 2021-08-16 ENCOUNTER — Other Ambulatory Visit: Payer: Self-pay

## 2021-08-16 ENCOUNTER — Ambulatory Visit
Admission: EM | Admit: 2021-08-16 | Discharge: 2021-08-16 | Disposition: A | Discharge status: Home / Self Care | Attending: Internal Medicine | Admitting: Internal Medicine

## 2021-08-16 DIAGNOSIS — Z20822 Contact with and (suspected) exposure to covid-19: Secondary | ICD-10-CM | POA: Insufficient documentation

## 2021-08-16 DIAGNOSIS — J069 Acute upper respiratory infection, unspecified: Secondary | ICD-10-CM

## 2021-08-16 DIAGNOSIS — R051 Acute cough: Secondary | ICD-10-CM | POA: Diagnosis not present

## 2021-08-16 DIAGNOSIS — R5381 Other malaise: Secondary | ICD-10-CM | POA: Diagnosis not present

## 2021-08-16 MED ORDER — HYDROCOD POLI-CHLORPHE POLI ER 10-8 MG/5ML PO SUER: 5.0000 mL | Freq: Every evening | ORAL | 0 refills | Status: AC | PRN

## 2021-08-16 MED ORDER — PREDNISONE 50 MG PO TABS
50.0000 mg | ORAL_TABLET | Freq: Every day | ORAL | 0 refills | Status: AC
Start: 2021-08-16 — End: ?

## 2021-08-16 NOTE — ED Provider Notes (Signed)
?[place] ? ? ? ?CSN: [id number] ?Arrival date & time: 08/16/21  1426 ? ? ?  ? ?History   ?Chief Complaint ?Chief Complaint  ?Patient presents with  ? Cough  ? Nasal Congestion  ?   ?  ? ? ?HPI ?Joshua Fry is a 36 y.o. male. He presents today with cough, runny/congested nose and malaise for about the last week.  Little bit of diarrhea.  Feels bad enough that he is missing work.  Has sleep apnea and coughing enough at night that he can't use his CPAP.  No fever.  Not vomiting.  Feels like he usually bounces back from respiratory infections faster than this, like in a few days. ? ? ?Cough ? ?Past Medical History:  ?Diagnosis Date  ? Acid reflux   ? History of chicken pox   ? Lyme disease   ? Unknown  ? ? ?Patient Active Problem List  ? Diagnosis Date Noted  ? OSA (obstructive sleep apnea) 11/25/2020  ? ADHD (attention deficit hyperactivity disorder), inattentive type 11/25/2020  ? PTSD (post-traumatic stress disorder) 04/08/2020  ? Anorgasmia of male 04/08/2020  ? Depression, recurrent ([place]) 04/08/2020  ? Decreased libido 03/14/2017  ? Seasonal allergic rhinitis 10/10/2016  ? Back pain, lumbosacral 07/21/2015  ? Epididymal cyst 07/17/2014  ? GERD (gastroesophageal reflux disease) 07/17/2014  ? ? ?Past Surgical History:  ?Procedure Laterality Date  ? ORIF FACIAL FRACTURE    ? Left Cheek  ? WISDOM TOOTH EXTRACTION    ? ? ? ? ? ?Home Medications   ? ?Prior to Admission medications   ?Medication Sig Start Date End Date Taking? Authorizing Provider  ?amLODipine (NORVASC) 5 MG tablet Take 1 tablet by mouth daily. 06/20/21  Yes [provider]  ?amphetamine-dextroamphetamine (ADDERALL) 15 MG tablet Take 15 mg by mouth daily.   Yes [provider]  ?chlorpheniramine-HYDROcodone (TUSSIONEX PENNKINETIC ER) 10-8 MG/5ML Take 5 mLs by mouth at bedtime as needed for cough. 08/16/21  Yes Wynona Luna, MD  ?predniSONE (DELTASONE) 50 MG tablet Take 1 tablet (50 mg total) by mouth daily.  08/16/21  Yes Wynona Luna, MD  ?amphetamine-dextroamphetamine (ADDERALL XR) 15 MG 24 hr capsule Take 1 capsule by mouth every morning. 11/25/20 02/23/21  Allwardt, Randa Evens, PA-C  ?amphetamine-dextroamphetamine (ADDERALL) 15 MG tablet Take 1 tablet by mouth daily. Take around 2 pm daily. 11/25/20 02/23/21  Allwardt, Randa Evens, PA-C  ?azelastine (ASTELIN) 0.1 % nasal spray U 1 SPR IEN BID 10/29/18   [provider]  ?busPIRone (BUSPAR) 15 MG tablet TAKE 1 TABLET TWICE DAILY 05/09/21   Elwanda Brooklyn, NP  ?busPIRone (BUSPAR) 15 MG tablet Take 1 tablet (15 mg total) by mouth 3 (three) times daily. 06/16/21   Elwanda Brooklyn, NP  ?fluticasone (FLONASE) 50 MCG/ACT nasal spray SHAKE LQ AND U 1 TO 2 SPRAYS IEN QD 10/29/18   [provider]  ?gabapentin (NEURONTIN) 100 MG capsule Take 1 capsule (100 mg total) by mouth at bedtime. 07/27/21   Elwanda Brooklyn, NP  ?gabapentin (NEURONTIN) 300 MG capsule Take 300 mg by mouth 3 (three) times daily.    [provider]  ?omeprazole (PRILOSEC) 20 MG capsule take 1 capsule by mouth once daily 10/11/16   Brunetta Jeans, PA-C  ?prazosin (MINIPRESS) 1 MG capsule  05/20/20   [provider]  ?TRINTELLIX 20 MG TABS tablet TAKE 1 TABLET(20 MG) BY MOUTH DAILY 09/13/20   Allwardt, Randa Evens, PA-C  ? ? ?Family History ?  Family History  ?Problem Relation Age of Onset  ? Healthy Mother   ?     Living  ? Healthy Father   ?     Living  ? Lung cancer Maternal Grandfather   ? Throat cancer Maternal Grandfather   ? Cancer Maternal Uncle   ? Healthy Brother   ?     x3  ? Heart attack Brother   ? ? ?Social History ?Social History  ? ?Tobacco Use  ? Smoking status: Former  ?  Types: Cigarettes  ? Smokeless tobacco: Never  ? Tobacco comments:  ?  Quit >5 yrs  ?Vaping Use  ? Vaping Use: Never used  ?Substance Use Topics  ? Alcohol use: Yes  ?  Alcohol/week: 0.0 standard drinks  ?  Comment: rare  ? Drug use: No  ? ? ? ?Allergies   ?Patient has no known allergies. ? ? ?Review  of Systems ?Review of Systems  ?Respiratory:  Positive for cough.    See also HPI ? ? ?Physical Exam ?Triage Vital Signs ?ED Triage Vitals  ?Enc Vitals Group  ?   BP 08/16/21 1459 (!) 141/89  ?   Pulse Rate 08/16/21 1459 89  ?   Resp 08/16/21 1459 18  ?   Temp 08/16/21 1459 98.4 ?F (36.9 ?C)  ?   Temp Source 08/16/21 1459 Oral  ?   SpO2 08/16/21 1459 100 %  ?   Weight --   ?   Height --   ?   Pain Score 08/16/21 1501 0  ?   Pain Loc --   ? ?Updated Vital Signs ?BP (!) 141/89 (BP Location: Left Arm)   Pulse 89   Temp 98.4 ?F (36.9 ?C) (Oral)   Resp 18   SpO2 100%  ? ?Physical Exam ?Constitutional:   ?   General: He is not in acute distress. ?   Appearance: He is not ill-appearing or toxic-appearing.  ?   Comments: Good hygiene  ?HENT:  ?   Head: Atraumatic.  ?   Comments: B TMs opaque, no erythema ?Bilateral mod to severe nasal congestion with mucopurulent material present ?Throat mildly injected ?   Mouth/Throat:  ?   Mouth: Mucous membranes are moist.  ?Eyes:  ?   Conjunctiva/sclera:  ?   Right eye: Right conjunctiva is not injected. No exudate. ?   Left eye: Left conjunctiva is not injected. No exudate. ?   Comments: Conjugate gaze observed  ?Cardiovascular:  ?   Rate and Rhythm: Normal rate and regular rhythm.  ?Pulmonary:  ?   Effort: Pulmonary effort is normal. No respiratory distress.  ?   Breath sounds: No wheezing or rhonchi.  ?Abdominal:  ?   General: There is no distension.  ?Musculoskeletal:  ?   Cervical back: Neck supple.  ?   Comments: Able to walk into the urgent care independently  ?Skin: ?   General: Skin is warm and dry.  ?   Comments: Pink, no cyanosis  ?Neurological:  ?   Mental Status: He is alert.  ?   Comments: Face symmetric, speech clear/coherent/logical  ? ? ? ?UC Treatments / Results  ?Labs ?(all labs ordered are listed, but only abnormal results are displayed) ?Labs Reviewed  ?SARS CORONAVIRUS 2 (TAT 6-24 HRS)  ?Covid test negative ? ?EKG ?NA ? ?Radiology ?No results  found. ?NA ? ?Procedures ?Procedures (including critical care time) ?NA ? ?Medications Ordered in UC ?Medications - No data to display ?NA ? ?Final  Clinical Impressions(s) / UC Diagnoses  ? ?Final diagnoses:  ?Acute upper respiratory infection  ? ? ? ?Discharge Instructions   ? ?  ?Symptoms and exam today suggest a viral respiratory infection.  No danger signs on exam; no signs of bacterial infection.  Test for covid is pending; there is not a particularly helpful treatment for covid after 5 days of symptoms, but it can be helpful to know if respiratory symptoms and fatigue are likely to be a bit prolonged in resolving.  Prescriptions for tussionex (cough medicine) and prednisone (for congestion) were sent to the pharmacy.  Push fluids and rest.  Take tylenol or advil otc as needed for fever, discomfort.  Eat fruits and vegetables to help your immune system do its best work.  Anticipate gradual improvement over the next several days.  Recheck for new fever >100.5, increasing phlegm production/nasal discharge, or if not starting to improve in a few days.    ? ? ?ED Prescriptions   ? ? Medication Sig Dispense Auth. Provider  ? predniSONE (DELTASONE) 50 MG tablet Take 1 tablet (50 mg total) by mouth daily. 3 tablet Wynona Luna, MD  ? chlorpheniramine-HYDROcodone [facility] ER) 10-8 MG/5ML Take 5 mLs by mouth at bedtime as needed for cough. 115 mL Wynona Luna, MD  ? ?  ? ?I have reviewed the PDMP during this encounter. ?  ?Wynona Luna, MD ?08/19/21 1428 ? ?

## 2021-08-16 NOTE — Discharge Instructions (Addendum)
Symptoms and exam today suggest a viral respiratory infection.  No danger signs on exam; no signs of bacterial infection.  Test for covid is pending; there is not a particularly helpful treatment for covid after 5 days of symptoms, but it can be helpful to know if respiratory symptoms and fatigue are likely to be a bit prolonged in resolving.  Prescriptions for tussionex (cough medicine) and prednisone (for congestion) were sent to the pharmacy.  Push fluids and rest.  Take tylenol or advil otc as needed for fever, discomfort.  Eat fruits and vegetables to help your immune system do its best work.  Anticipate gradual improvement over the next several days.  Recheck for new fever >100.5, increasing phlegm production/nasal discharge, or if not starting to improve in a few days.    ?

## 2021-08-16 NOTE — ED Triage Notes (Signed)
Pt reports cough, fatigue and nasal congestion x 1 week. DayQuil and Mucinex gives no relief.  ?

## 2021-08-17 LAB — SARS CORONAVIRUS 2 (TAT 6-24 HRS): SARS Coronavirus 2: NEGATIVE

## 2021-08-23 ENCOUNTER — Ambulatory Visit: Admitting: Mental Health

## 2021-09-08 ENCOUNTER — Ambulatory Visit (INDEPENDENT_AMBULATORY_CARE_PROVIDER_SITE_OTHER): Admitting: Mental Health

## 2021-09-08 ENCOUNTER — Encounter: Payer: Self-pay | Admitting: Behavioral Health

## 2021-09-08 ENCOUNTER — Ambulatory Visit (INDEPENDENT_AMBULATORY_CARE_PROVIDER_SITE_OTHER): Admitting: Behavioral Health

## 2021-09-08 DIAGNOSIS — F331 Major depressive disorder, recurrent, moderate: Secondary | ICD-10-CM | POA: Diagnosis not present

## 2021-09-08 DIAGNOSIS — F4310 Post-traumatic stress disorder, unspecified: Secondary | ICD-10-CM

## 2021-09-08 DIAGNOSIS — F411 Generalized anxiety disorder: Secondary | ICD-10-CM

## 2021-09-08 DIAGNOSIS — F902 Attention-deficit hyperactivity disorder, combined type: Secondary | ICD-10-CM

## 2021-09-08 MED ORDER — AMPHETAMINE-DEXTROAMPHET ER 20 MG PO CP24: 20.0000 mg | ORAL_CAPSULE | Freq: Every day | ORAL | 0 refills | Status: DC

## 2021-09-08 MED ORDER — AMPHETAMINE-DEXTROAMPHETAMINE 15 MG PO TABS
15.0000 mg | ORAL_TABLET | Freq: Every day | ORAL | 0 refills | Status: DC
Start: 2021-09-08 — End: 2021-10-04

## 2021-09-08 NOTE — Progress Notes (Signed)
[facility] psychotherapy note ? ?Name: Joshua Fry ?Date:  09/08/21 ?MRN: [id number] ?DOB: 02-01-1986 ?PCP: Allwardt, Randa Evens, PA-C ? ?Time spent: 52 minutes ? ?Treatment:  individual therapy ? ? ?Mental Status Exam: ?   ?Appearance:    Casual     ?Behavior:   Appropriate  ?Motor:   WNL  ?Speech/Language:    Clear and Coherent  ?Affect:   Full range   ?Mood:   Anxious, pleasant  ?Thought process:   Logical, linear, goal directed  ?Thought content:     WNL  ?Sensory/Perceptual disturbances:     none  ?Orientation:   x4  ?Attention:   Good  ?Concentration:   Good  ?Memory:   Intact  ?Fund of knowledge:    Consistent with age and development  ?Insight:     Good  ?Judgment:    Good  ?Impulse Control:   Good  ?  ? ?Reported Symptoms:  sleep problems (night terrors), irritability, panic attacks, agitation ? ?Risk Assessment: ?Danger to Self:  No ?Self-injurious Behavior: No ?Danger to Others: No ?Duty to Warn:no ?Physical Aggression / Violence:No  ?Access to Firearms a concern: No  ?Gang Involvement:No  ?Patient / guardian was educated about steps to take if suicide or homicide risk level increases between visits: yes ?While future psychiatric events cannot be accurately predicted, the patient does not currently require acute inpatient psychiatric care and does not currently meet [facility] involuntary commitment criteria. ? ?Subjective: ?Patient arrived on time for today's session.  Patient shared recent progress, stated that he had an enjoyable vacation with his father as they have an annual golf outing with many friends.  He went on to focus on work stressors, continues to feel a sense of burnout in his job due to being overextended due to his having considerable knowledge in his field and his supervisor having little to no experience in comparison.  Through guided discovery, he identified the need to draw some limits with his efforts at work.  He stated he continues to plan on doing a complete and thorough  job with his job duties while also recognizing when to allow his supervisor to handle situations such as feeling that many questions that can be presented.  He also identified the need to not overextend his work hours on a consistent basis which he has done before when projects were out of town as it affects his relationship with his girlfriend due to their being apart. ? ? ?Interventions: Further assessment, motivational interviewing ? ? ?Diagnoses:  ?  ICD-10-CM   ?1. Generalized anxiety disorder  F41.1   ?  ? ? ? ? ? ?Plan: Patient is to use CBT, mindfulness and coping skills to help manage decrease symptoms.  Patient to continue to work to recognize what he can control versus what he cannot in the way of trying to be supportive to those in his life and also meeting his own interpersonal needs to keep his stress and anxiety low. ?  ?Long-term goal:  Reduce overall level, frequency, and intensity of the feelings of depression, anxiety and panic so that daily functioning is not impaired. ? ?Short-term goal: To identify and process feelings related to the disappointment of past painful events  ?                  Improve ability to relax by utilizing coping skills as discussed ?       Identify and utilize emotional regulatory skills   ? ?Assessment of progress:  progressing   ? ? ?Anson Oregon, [facility]  ? ? ? ?

## 2021-09-08 NOTE — Progress Notes (Signed)
[facility] Med Check ? ?Patient ID: Madelon Lips,  ?MRN: [id number] ? ?PCP: Allwardt, Crist Infante, PA-C ? ?Date of Evaluation: 09/08/2021 ?Time spent:20 minutes ? ?Chief Complaint:  ?Chief Complaint   ?Anxiety; Depression; Trauma; ADHD; Follow-up; Medication Refill ?  ? ? ?HISTORY/CURRENT STATUS: ?HPI ? ?  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 He says that he has been doing well overall but his stress from his job has increased. He understands he needs to makes decisions about his future with work. For now he does not want to make changes to his medication regimen.  He says his anxiety today is 4/10 and depression is 3/10. He was able to successfully stop Gabapentin and Prazosin.   Still endorses periods of irritability and racing thoughts. Gets frustrated at other employees that do not share his sense of attention to detail or work ethic as he does.  He denies mania, no auditory or visual hallucinations. Denies SI or HI. [facility]  ?  ?Past psychiatric medication trials: ?Zoloft ?Lexapro ?Trintellix  ? ? ? ? ? ?Individual Medical History/ Review of Systems: Changes? :No  ? ?Allergies: Patient has no known allergies. ? ?Current Medications:  ?Current Outpatient Medications:  ?  amphetamine-dextroamphetamine (ADDERALL XR) 20 MG 24 hr capsule, Take 1 capsule (20 mg total) by mouth daily., Disp: 30 capsule, Rfl: 0 ?  amLODipine (NORVASC) 5 MG tablet, Take 1 tablet by mouth daily., Disp: , Rfl:  ?  amphetamine-dextroamphetamine (ADDERALL XR) 15 MG 24 hr capsule, Take 1 capsule by mouth every morning., Disp: 90 capsule, Rfl: 0 ?  amphetamine-dextroamphetamine (ADDERALL) 15 MG tablet, Take 15 mg by mouth daily., Disp: , Rfl:  ?  amphetamine-dextroamphetamine (ADDERALL) 15 MG tablet, Take 1 tablet by mouth daily. Take around 2 pm daily., Disp: 90 tablet, Rfl: 0 ?  azelastine (ASTELIN) 0.1 % nasal spray, U 1 SPR IEN BID, Disp: , Rfl:  ?   busPIRone (BUSPAR) 15 MG tablet, TAKE 1 TABLET TWICE DAILY, Disp: 60 tablet, Rfl: 0 ?  busPIRone (BUSPAR) 15 MG tablet, Take 1 tablet (15 mg total) by mouth 3 (three) times daily., Disp: 90 tablet, Rfl: 3 ?  chlorpheniramine-HYDROcodone (TUSSIONEX PENNKINETIC ER) 10-8 MG/5ML, Take 5 mLs by mouth at bedtime as needed for cough., Disp: 115 mL, Rfl: 0 ?  fluticasone (FLONASE) 50 MCG/ACT nasal spray, SHAKE LQ AND U 1 TO 2 SPRAYS IEN QD, Disp: , Rfl:  ?  gabapentin (NEURONTIN) 100 MG capsule, Take 1 capsule (100 mg total) by mouth at bedtime., Disp: 30 capsule, Rfl: 1 ?  gabapentin (NEURONTIN) 300 MG capsule, Take 300 mg by mouth 3 (three) times daily., Disp: , Rfl:  ?  omeprazole (PRILOSEC) 20 MG capsule, take 1 capsule by mouth once daily, Disp: 30 capsule, Rfl: 3 ?  prazosin (MINIPRESS) 1 MG capsule, , Disp: , Rfl:  ?  predniSONE (DELTASONE) 50 MG tablet, Take 1 tablet (50 mg total) by mouth daily., Disp: 3 tablet, Rfl: 0 ?  TRINTELLIX 20 MG TABS tablet, TAKE 1 TABLET(20 MG) BY MOUTH DAILY, Disp: 30 tablet, Rfl: 2 ?Medication Side Effects: none ? ?Family Medical/ Social History: Changes? No ? ?MENTAL HEALTH EXAM: ? ?There were no vitals taken for this visit.There is no height or weight on file to calculate BMI.  ?General Appearance: Casual, Neat, and Well Groomed  ?Eye Contact:  Good  ?Speech:  Clear and Coherent  ?Volume:  Normal  ?Mood:  NA  ?Affect:  Appropriate  ?Thought Process:  Coherent  ?Orientation:  Full (Time, Place, and Person)  ?Thought Content: Logical   ?Suicidal Thoughts:  No  ?Homicidal Thoughts:  No  ?Memory:  WNL  ?Judgement:  NA  ?Insight:  Good  ?Psychomotor Activity:  Normal  ?Concentration:  Concentration: Good  ?Recall:  Good  ?Fund of Knowledge: Good  ?Language: Good  ?Assets:  Desire for Improvement  ?ADL's:  Intact  ?Cognition: WNL  ?Prognosis:  Good  ? ? ?DIAGNOSES:  ?  ICD-10-CM   ?1. PTSD (post-traumatic stress disorder)  F43.10   ?  ?2. Attention deficit hyperactivity disorder (ADHD),  combined type  F90.2 amphetamine-dextroamphetamine (ADDERALL XR) 20 MG 24 hr capsule  ?  amphetamine-dextroamphetamine (ADDERALL) 15 MG tablet  ?  ?3. Generalized anxiety disorder  F41.1   ?  ?4. Major depressive disorder, recurrent episode, moderate (HCC)  F33.1   ?  ? ? ?Receiving Psychotherapy: No  ? ? ?RECOMMENDATIONS:  ? ?Greater than 50% of 30 min face to face time with patient was spent on counseling and coordination of care. Discussed his current status with anxiety. 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 He is at crossroads on if he should stay or look for another job. He believes his situation will get better if he can alleviate some of the stressors at work. He does not want to change his medications right now.  We agreed on the following: ?  ?To continue Trintellix 20 mg daily ?Stopped Prazosin ?Stopped Gabapentin ?To continue Buspar to 15 mg three times daily at least 7-8 hours apart. Educated pt that Buspar is more effective if taking multiple times daily.  ?Continue Adderall 120 mg XR in the am daily ?Continue Adderall 15 mg around 2 pm daily for boost. ?Will report any side effects or worsening symptoms promptly ?Will follow up in 6 weeks to reassess ?Provided emergency contact information ?Discussed potential benefits, risks, and side effects of stimulants with patient to include increased heart rate, palpitations, insomnia, increased anxiety, increased irritability, or decreased appetite.  Instructed patient to contact office if experiencing any significant tolerability issues.  ?Reviewed PDMP ?  ? ? ? ? ? ?Joan Flores, NP  ?

## 2021-09-16 ENCOUNTER — Other Ambulatory Visit: Payer: Self-pay | Admitting: Behavioral Health

## 2021-09-16 DIAGNOSIS — F411 Generalized anxiety disorder: Secondary | ICD-10-CM

## 2021-09-16 DIAGNOSIS — F4310 Post-traumatic stress disorder, unspecified: Secondary | ICD-10-CM

## 2021-09-16 DIAGNOSIS — F331 Major depressive disorder, recurrent, moderate: Secondary | ICD-10-CM

## 2021-09-23 ENCOUNTER — Telehealth: Payer: Self-pay | Admitting: Behavioral Health

## 2021-09-23 NOTE — Telephone Encounter (Signed)
Patient called in regards to his Adderall prescription. States that he spoke with his [facility] and was informed that Adderall XR is being reformulated and is on a backorder. He was also told to speak to his provider about being switched to something else. Pls rtc to discuss [phone] ?

## 2021-09-25 ENCOUNTER — Other Ambulatory Visit: Payer: Self-pay | Admitting: Behavioral Health

## 2021-09-25 DIAGNOSIS — F331 Major depressive disorder, recurrent, moderate: Secondary | ICD-10-CM

## 2021-09-25 DIAGNOSIS — F411 Generalized anxiety disorder: Secondary | ICD-10-CM

## 2021-09-25 DIAGNOSIS — F4310 Post-traumatic stress disorder, unspecified: Secondary | ICD-10-CM

## 2021-09-25 NOTE — Telephone Encounter (Signed)
Verify pharmacy 

## 2021-09-26 NOTE — Telephone Encounter (Signed)
Please advise patient that we are having trouble getting most all stimulants now. He has appt on 5/18. See if he is willing to wait to discuss in person. We have other obstacle like insurance and pre-authorization which are a factor. It will be counter productive to just start writing things to try without discussion and planning.

## 2021-09-26 NOTE — Telephone Encounter (Signed)
LVM to RC 

## 2021-09-26 NOTE — Telephone Encounter (Signed)
Patient would like to try another medication in place of his XR dosing due to difficulty in being able to get it. He has been able to fill his IR dosing.  ?

## 2021-09-27 NOTE — Telephone Encounter (Signed)
LVM with info

## 2021-09-29 ENCOUNTER — Ambulatory Visit (INDEPENDENT_AMBULATORY_CARE_PROVIDER_SITE_OTHER): Admitting: Mental Health

## 2021-09-29 DIAGNOSIS — F411 Generalized anxiety disorder: Secondary | ICD-10-CM

## 2021-09-29 NOTE — Progress Notes (Signed)
[facility] note ? ?Name: Joshua Fry ?Date:  09/29/21 ?MRN: [id number] ?DOB: 1986-03-18 ?PCP: Allwardt, Crist Infante, PA-C ? ?Time spent: 51 minutes ? ?Treatment:  individual therapy ? ? ?Mental Status Exam: ?   ?Appearance:    Casual     ?Behavior:   Appropriate  ?Motor:   WNL  ?Speech/Language:    Clear and Coherent  ?Affect:   Full range   ?Mood:   Anxious, pleasant  ?Thought process:   Logical, linear, goal directed  ?Thought content:     WNL  ?Sensory/Perceptual disturbances:     none  ?Orientation:   x4  ?Attention:   Good  ?Concentration:   Good  ?Memory:   Intact  ?Fund of knowledge:    Consistent with age and development  ?Insight:     Good  ?Judgment:    Good  ?Impulse Control:   Good  ?  ? ?Reported Symptoms:  sleep problems (night terrors), irritability, panic attacks, agitation ? ?Risk Assessment: ?Danger to Self:  No ?Self-injurious Behavior: No ?Danger to Others: No ?Duty to Warn:no ?Physical Aggression / Violence:No  ?Access to Firearms a concern: No  ?Gang Involvement:No  ?Patient / guardian was educated about steps to take if suicide or homicide risk level increases between visits: yes ?While future psychiatric events cannot be accurately predicted, the patient does not currently require acute inpatient psychiatric care and does not currently meet [facility] involuntary commitment criteria. ? ?Subjective: ?Patient arrived on time for today's session.  Progress toward established goals as well as assessing relevant recent events with patient.  He stated that he has ongoing work stress, went on to share how he continues to struggle with having the support he needs.  He stated that his supervisor continues to be largely unhelpful.  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  Identified feelings of burnout recently.  Stated that he needed to have some time off work for an afternoon.  Stated that the owner of the company recently reached out to him and touch  base with him on a project but also as well as a recent discussion he had about 2 months ago.  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  He is considering other work options but would rather not make a change and get a new job at this point but also recognizes that he is not sure how much more he can handle at this pace and amount.  Ways to cope and care for himself, utilizing thought blocking to separate from thinking about work tasks as he states he tends to ruminate and finds himself thinking about work too often after hours.  Assisted him in framing thoughts toward identifying needs as well as boundaries and plans to put in place. ? ? ? ? ?Interventions: Further assessment, motivational interviewing ? ? ?Diagnoses:  ?  ICD-10-CM   ?1. Generalized anxiety disorder  F41.1   ?  ? ? ? ? ? ? ?Plan: Patient is to use CBT, mindfulness and coping skills to help manage decrease symptoms.  Patient to continue to work to recognize what he can control versus what he cannot in the way of trying to be supportive to those in his life and also meeting his own interpersonal needs to keep his stress and anxiety low. ?  ?Long-term goal:  Reduce overall level, frequency,  and intensity of the feelings of depression, anxiety and panic so that daily functioning is not impaired. ? ?Short-term goal: To identify and process feelings related to the disappointment of past painful events  ?                  Improve ability to relax by utilizing coping skills as discussed ?       Identify and utilize emotional regulatory skills   ? ?Assessment of progress:  progressing   ? ? ?[name] Session, [facility]  ? ? ? ?

## 2021-10-04 ENCOUNTER — Ambulatory Visit (INDEPENDENT_AMBULATORY_CARE_PROVIDER_SITE_OTHER): Admitting: Behavioral Health

## 2021-10-04 ENCOUNTER — Encounter: Payer: Self-pay | Admitting: Behavioral Health

## 2021-10-04 DIAGNOSIS — F902 Attention-deficit hyperactivity disorder, combined type: Secondary | ICD-10-CM | POA: Diagnosis not present

## 2021-10-04 DIAGNOSIS — F331 Major depressive disorder, recurrent, moderate: Secondary | ICD-10-CM

## 2021-10-04 DIAGNOSIS — F411 Generalized anxiety disorder: Secondary | ICD-10-CM | POA: Diagnosis not present

## 2021-10-04 DIAGNOSIS — F4310 Post-traumatic stress disorder, unspecified: Secondary | ICD-10-CM

## 2021-10-04 MED ORDER — AMPHETAMINE-DEXTROAMPHETAMINE 15 MG PO TABS
15.0000 mg | ORAL_TABLET | Freq: Every day | ORAL | 0 refills | Status: DC
Start: 2021-10-04 — End: 2021-10-04

## 2021-10-04 MED ORDER — LISDEXAMFETAMINE DIMESYLATE 30 MG PO CAPS: 30.0000 mg | ORAL_CAPSULE | Freq: Every day | ORAL | 0 refills | Status: DC

## 2021-10-04 NOTE — Progress Notes (Signed)
[facility] ? ?Patient ID: Joshua Fry,  ?MRN: [id number] ? ?PCP: Allwardt, Crist Infante, PA-C ? ?Date of Evaluation: 10/04/2021 ?Time spent:30 minutes ? ?Chief Complaint:  ?Chief Complaint   ?Anxiety; Depression; ADHD; Follow-up; Medication Refill; Trauma; Stress; Medication Problem ?  ? ? ?HISTORY/CURRENT STATUS: ?HPI ? 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 He continues to say  that he has been doing well overall but his stress from his job has increased. He does want to consider switching ADHD medication due to national shortage and says that he feels like Adderall is not working that well anymore.   He says his anxiety today is 3/10 and depression is 2/10.  Still endorses periods of irritability and racing thoughts.   He denies mania, no auditory or visual hallucinations. Denies SI or HI. [facility]  ?  ?Past psychiatric medication trials: ?Zoloft ?Lexapro ?Trintellix  ? ? ?Individual Medical History/ Review of Systems: Changes? :No  ? ?Allergies: Patient has no known allergies. ? ?Current Medications:  ?Current Outpatient Medications:  ?  lisdexamfetamine (VYVANSE) 30 MG capsule, Take 1 capsule (30 mg total) by mouth daily., Disp: 30 capsule, Rfl: 0 ?  amLODipine (NORVASC) 5 MG tablet, Take 1 tablet by mouth daily., Disp: , Rfl:  ?  azelastine (ASTELIN) 0.1 % nasal spray, U 1 SPR IEN BID, Disp: , Rfl:  ?  busPIRone (BUSPAR) 15 MG tablet, TAKE 1 TABLET TWICE DAILY, Disp: 60 tablet, Rfl: 0 ?  busPIRone (BUSPAR) 15 MG tablet, Take 1 tablet (15 mg total) by mouth 3 (three) times daily., Disp: 90 tablet, Rfl: 3 ?  chlorpheniramine-HYDROcodone (TUSSIONEX PENNKINETIC ER) 10-8 MG/5ML, Take 5 mLs by mouth at bedtime as needed for cough., Disp: 115 mL, Rfl: 0 ?  fluticasone (FLONASE) 50 MCG/ACT nasal spray, SHAKE LQ AND U 1 TO 2 SPRAYS IEN QD, Disp: , Rfl:  ?  gabapentin (NEURONTIN) 100 MG capsule, Take 1 capsule (100 mg total) by  mouth at bedtime., Disp: 30 capsule, Rfl: 1 ?  gabapentin (NEURONTIN) 300 MG capsule, Take 300 mg by mouth 3 (three) times daily., Disp: , Rfl:  ?  omeprazole (PRILOSEC) 20 MG capsule, take 1 capsule by mouth once daily, Disp: 30 capsule, Rfl: 3 ?  prazosin (MINIPRESS) 1 MG capsule, , Disp: , Rfl:  ?  predniSONE (DELTASONE) 50 MG tablet, Take 1 tablet (50 mg total) by mouth daily., Disp: 3 tablet, Rfl: 0 ?  TRINTELLIX 20 MG TABS tablet, TAKE 1 TABLET(20 MG) BY MOUTH DAILY, Disp: 30 tablet, Rfl: 2 ?Medication Side Effects: none ? ?Family Medical/ Social History: Changes? No ? ?MENTAL HEALTH EXAM: ? ?There were no vitals taken for this visit.There is no height or weight on file to calculate BMI.  ?General Appearance: Casual, Neat, and Well Groomed  ?Eye Contact:  Good  ?Speech:  Clear and Coherent  ?Volume:  Normal  ?Mood:  NA  ?Affect:  Appropriate  ?Thought Process:  Coherent  ?Orientation:  Full (Time, Place, and Person)  ?Thought Content: Logical   ?Suicidal Thoughts:  No  ?Homicidal Thoughts:  No  ?Memory:  WNL  ?Judgement:  Good  ?Insight:  Good  ?Psychomotor Activity:  Normal  ?Concentration:  Concentration: Good  ?Recall:  Good  ?Fund of Knowledge: Good  ?Language: Good  ?Assets:  Desire for Improvement  ?ADL's:  Intact  ?Cognition: WNL  ?Prognosis:  Good  ? ? ?DIAGNOSES:  ?  ICD-10-CM   ?  1. Attention deficit hyperactivity disorder (ADHD), combined type  F90.2 lisdexamfetamine (VYVANSE) 30 MG capsule  ?  DISCONTINUED: amphetamine-dextroamphetamine (ADDERALL) 15 MG tablet  ?  ?2. Generalized anxiety disorder  F41.1   ?  ?3. PTSD (post-traumatic stress disorder)  F43.10   ?  ?4. Major depressive disorder, recurrent episode, moderate (HCC)  F33.1   ?  ? ? ?Receiving Psychotherapy: No  ? ? ?RECOMMENDATIONS:  ? ?Greater than 50% of 30 min face to face time with patient was spent on counseling and coordination of care. Discussed his current status with anxiety. Still not happy with his job situation. Today he is  wanting to consider switching ADHD medication. Says that he feels like medication is not working as well anymore and he has had very difficult time finding medication due to [employer].   We agreed on the following: ?  ?To continue Trintellix 20 mg daily ?To continue Buspar to 15 mg three times daily at least 7-8 hours apart. Educated pt that Buspar is more effective if taking multiple times daily.  ?To stop Adderall 120 mg XR in the am daily ?To stop Adderall 15 mg around 2 pm daily for boost. ?To start Vraylar 30 mg daily ?Will report any side effects or worsening symptoms promptly ?Will follow up in 6 weeks to reassess ?Provided emergency contact information ?Discussed potential benefits, risks, and side effects of stimulants with patient to include increased heart rate, palpitations, insomnia, increased anxiety, increased irritability, or decreased appetite.  Instructed patient to contact office if experiencing any significant tolerability issues.  ?Reviewed PDMP ?  ? ?Joan Flores, NP  ?

## 2021-10-07 ENCOUNTER — Telehealth: Payer: Self-pay

## 2021-10-07 NOTE — Telephone Encounter (Signed)
Prior Authorization submitted and approved for VYVANSE 30 MG effective 09/07/2021-05/28/2098 with [employer] PA# [id number] ? ?ID# [id number] ?

## 2021-10-13 ENCOUNTER — Ambulatory Visit: Admitting: Behavioral Health

## 2021-10-13 ENCOUNTER — Ambulatory Visit: Admitting: Mental Health

## 2021-10-27 ENCOUNTER — Ambulatory Visit (INDEPENDENT_AMBULATORY_CARE_PROVIDER_SITE_OTHER): Admitting: Mental Health

## 2021-10-27 DIAGNOSIS — F411 Generalized anxiety disorder: Secondary | ICD-10-CM

## 2021-10-27 DIAGNOSIS — F902 Attention-deficit hyperactivity disorder, combined type: Secondary | ICD-10-CM

## 2021-10-27 NOTE — Progress Notes (Signed)
[facility] note  Name: Joshua Fry Date:  10/27/21 MRN: [id number] DOB: Oct 20, 1985 PCP: Fredirick Lathe, PA-C  Time spent: 50 minutes  Treatment:  individual therapy   Mental Status Exam:    Appearance:    Casual     Behavior:   Appropriate  Motor:   WNL  Speech/Language:    Clear and Coherent  Affect:   Full range   Mood:   pleasant, euthymic  Thought process:   Logical, linear, goal directed  Thought content:     WNL  Sensory/Perceptual disturbances:     none  Orientation:   x4  Attention:   Good  Concentration:   Good  Memory:   Intact  Fund of knowledge:    Consistent with age and development  Insight:     Good  Judgment:    Good  Impulse Control:   Good     Reported Symptoms:  sleep problems (night terrors), irritability, panic attacks, agitation  Risk Assessment: Danger to Self:  No Self-injurious Behavior: No Danger to Others: No Duty to Warn:no Physical Aggression / Violence:No  Access to Firearms a concern: No  Gang Involvement:No  Patient / guardian was educated about steps to take if suicide or homicide risk level increases between visits: yes While future psychiatric events cannot be accurately predicted, the patient does not currently require acute inpatient psychiatric care and does not currently meet [facility] involuntary commitment criteria.  Subjective: Patient arrived on time for today's session.  Shared recent events relevant to progress.  He shared work related stress.  He stated he has worked to become less overwhelmed and specifically challenging some of his thinking.  He stated that he is trying to be more positive, instead of framing thoughts about work related stressors, he has worked to replace this with "it can get better". He continues to share details of the challenges of work and how he works to overcome them by being resourceful, utilizing his considerable amount of knowledge he brings to the job.  He also stated  he has worked to remind himself that some of the changes that he would like to see take place may happen over time and may have to just become more apparent to leadership through trial and error.  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  He stated that it was frustrating but denied it causing anxiety or any highly distressful emotions related to feelings of trauma.  Some of the experiences he had to endure, the challenges of serving in the [employer] were further processed throughout the session.    Interventions: Further assessment, motivational interviewing   Diagnoses:    ICD-10-CM   1. Attention deficit hyperactivity disorder (ADHD), combined type  F90.2     2. Generalized anxiety disorder  F41.1         Plan: Patient is to use CBT, mindfulness and coping skills to help manage decrease symptoms.  Patient to continue to work to recognize what he can control versus what he cannot in the way of trying to be supportive to those in his life and also meeting his own interpersonal needs to keep his stress and anxiety low.   Long-term goal:  Reduce overall level, frequency, and intensity of the feelings of depression, anxiety and panic so that daily functioning is not impaired.  Short-term goal: To identify and process feelings related to the disappointment of past painful events  Improve ability to relax by utilizing coping skills as discussed        Identify and utilize emotional regulatory skills    Assessment of progress:  progressing     Anson [place], [facility]

## 2021-11-07 ENCOUNTER — Other Ambulatory Visit: Payer: Self-pay

## 2021-11-07 ENCOUNTER — Telehealth: Payer: Self-pay | Admitting: Behavioral Health

## 2021-11-07 DIAGNOSIS — F902 Attention-deficit hyperactivity disorder, combined type: Secondary | ICD-10-CM

## 2021-11-07 MED ORDER — LISDEXAMFETAMINE DIMESYLATE 30 MG PO CAPS: 30.0000 mg | ORAL_CAPSULE | Freq: Every day | ORAL | 0 refills | Status: DC

## 2021-11-07 NOTE — Telephone Encounter (Signed)
Pt requesting RF Vyvanse @ [facility] [place]. APT 6/20

## 2021-11-07 NOTE — Telephone Encounter (Signed)
Pended.

## 2021-11-10 ENCOUNTER — Ambulatory Visit: Admitting: Mental Health

## 2021-11-15 ENCOUNTER — Ambulatory Visit: Admitting: Behavioral Health

## 2021-11-24 ENCOUNTER — Ambulatory Visit (INDEPENDENT_AMBULATORY_CARE_PROVIDER_SITE_OTHER): Admitting: Mental Health

## 2021-11-24 DIAGNOSIS — F411 Generalized anxiety disorder: Secondary | ICD-10-CM | POA: Diagnosis not present

## 2021-11-24 NOTE — Progress Notes (Signed)
[facility] note  Name: Joshua Fry Date:  11/24/21 MRN: [id number] DOB: [birth year] PCP: Bary Leriche, PA-C  Time spent: 50 minutes  Treatment:  individual therapy   Mental Status Exam:    Appearance:    Casual     Behavior:   Appropriate  Motor:   WNL  Speech/Language:    Clear and Coherent  Affect:   Full range   Mood:   pleasant, euthymic  Thought process:   Logical, linear, goal directed  Thought content:     WNL  Sensory/Perceptual disturbances:     none  Orientation:   x4  Attention:   Good  Concentration:   Good  Memory:   Intact  Fund of knowledge:    Consistent with age and development  Insight:     Good  Judgment:    Good  Impulse Control:   Good     Reported Symptoms:  sleep problems (night terrors), irritability, panic attacks, agitation  Risk Assessment: Danger to Self:  No Self-injurious Behavior: No Danger to Others: No Duty to Warn:no Physical Aggression / Violence:No  Access to Firearms a concern: No  Gang Involvement:No  Patient / guardian was educated about steps to take if suicide or homicide risk level increases between visits: yes While future psychiatric events cannot be accurately predicted, the patient does not currently require acute inpatient psychiatric care and does not currently meet [facility] involuntary commitment criteria.  Subjective: Patient arrived on time for today's session.  Assess progress toward goals as well as relevant recent events.  He stated that he has continued to struggle with daily motivation at work.  He went on to share how he is completing work tasks, keeping up with projects however, feels he has lost his sense of drive that he had a few years ago.  Facilitated his identifying subsequent thoughts, potential experiences that he feels contribute.  He identified the need to further discuss at his next medication management appointment the effects of his medication.  He stated that he feels  they are helpful, less agitation, anger outbursts as he would have just a few years ago, however, describes feeling a lacking of emotion.  Explored ways to cope and care for himself, he identified the need to limit the time frame he plays video games in the evening which he feels may play a role in impairing his sleep.  He stated that he sometimes will go to bed at inconsistent timeframes and also when waking up.  He also identified the need to start working out more consistently.    Interventions: Further assessment, motivational interviewing   Diagnoses:    ICD-10-CM   1. Generalized anxiety disorder  F41.1        Plan: Patient is to use CBT, mindfulness and coping skills to help manage /decrease symptoms.  Patient to continue to work to recognize what he can control versus what he cannot in the way of trying to be supportive to those in his life and also meeting his own interpersonal needs to keep his stress and anxiety low.   Long-term goal:  Reduce overall level, frequency, and intensity of the feelings of depression, anxiety and panic so that daily functioning is not impaired.  Short-term goal: To identify and process feelings related to the disappointment of past painful events                    Improve ability to relax by utilizing coping skills as discussed  Identify and utilize emotional regulatory skills    Assessment of progress:  progressing     [name] Session, [facility]

## 2021-11-28 ENCOUNTER — Ambulatory Visit (INDEPENDENT_AMBULATORY_CARE_PROVIDER_SITE_OTHER): Admitting: Behavioral Health

## 2021-11-28 ENCOUNTER — Encounter: Payer: Self-pay | Admitting: Behavioral Health

## 2021-11-28 DIAGNOSIS — F411 Generalized anxiety disorder: Secondary | ICD-10-CM

## 2021-11-28 DIAGNOSIS — F4310 Post-traumatic stress disorder, unspecified: Secondary | ICD-10-CM

## 2021-11-28 DIAGNOSIS — F331 Major depressive disorder, recurrent, moderate: Secondary | ICD-10-CM | POA: Diagnosis not present

## 2021-11-28 DIAGNOSIS — F902 Attention-deficit hyperactivity disorder, combined type: Secondary | ICD-10-CM | POA: Diagnosis not present

## 2021-11-28 MED ORDER — LISDEXAMFETAMINE DIMESYLATE 40 MG PO CAPS: 40.0000 mg | ORAL_CAPSULE | ORAL | 0 refills | Status: DC

## 2021-11-28 NOTE — Progress Notes (Signed)
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 He continues to say  that he has been doing well overall. He is continuing with therapy and learning to work through some of his stressors on the job. He is questioning the necessity to continue with his medications. After discuss he says that he realized that he quit his meds before and he quickly returned to them realizing they were working. He will talk to his PCP about having his testosterone levels checked. He is requesting consideration on increasing his Vyvanse.  He says his anxiety today is 2/10 and depression is 2/10.  Still endorses periods of irritability and racing thoughts.   He denies mania, no auditory or visual hallucinations. Denies SI or HI. [facility].    Past psychiatric medication trials: Zoloft Lexapro Trintellix  Adderall- Scientist, clinical (histocompatibility and immunogenetics)    Individual Medical History/ Review of Systems: Changes? :No   Allergies: Patient has no known allergies.  Current Medications:  Current Outpatient Medications:    [START ON 12/07/2021] lisdexamfetamine (VYVANSE) 40 MG capsule, Take 1 capsule (40 mg total) by mouth every morning., Disp: 30 capsule, Rfl: 0   amLODipine (NORVASC) 5 MG tablet, Take 1 tablet by mouth daily., Disp: , Rfl:    azelastine (ASTELIN) 0.1 % nasal spray, U 1 SPR IEN BID, Disp: , Rfl:    busPIRone (BUSPAR) 15 MG tablet, Take 1 tablet (15 mg total) by mouth 3 (three) times daily., Disp: 90 tablet, Rfl: 3   chlorpheniramine-HYDROcodone (TUSSIONEX PENNKINETIC ER) 10-8 MG/5ML, Take 5 mLs by mouth at bedtime as needed for cough., Disp: 115 mL, Rfl: 0   fluticasone (FLONASE) 50 MCG/ACT nasal spray, SHAKE LQ AND U 1 TO 2 SPRAYS IEN QD, Disp:  , Rfl:    lisdexamfetamine (VYVANSE) 30 MG capsule, Take 1 capsule (30 mg total) by mouth daily., Disp: 30 capsule, Rfl: 0   omeprazole (PRILOSEC) 20 MG capsule, take 1 capsule by mouth once daily, Disp: 30 capsule, Rfl: 3   prazosin (MINIPRESS) 1 MG capsule, , Disp: , Rfl:    predniSONE (DELTASONE) 50 MG tablet, Take 1 tablet (50 mg total) by mouth daily., Disp: 3 tablet, Rfl: 0   TRINTELLIX 20 MG TABS tablet, TAKE 1 TABLET(20 MG) BY MOUTH DAILY, Disp: 30 tablet, Rfl: 2 Medication Side Effects: none  Family Medical/ Social History: Changes? No  MENTAL HEALTH EXAM:  There were no vitals taken for this visit.There is no height or weight on file to calculate BMI.  General Appearance: Casual and Neat  Eye Contact:  Good  Speech:  Clear and Coherent  Volume:  Normal  Mood:  NA  Affect:  Appropriate  Thought Process:  Coherent  Orientation:  Full (Time, Place, and Person)  Thought Content: Logical   Suicidal Thoughts:  No  Homicidal Thoughts:  No  Memory:  WNL  Judgement:  Good  Insight:  Good  Psychomotor Activity:  Normal  Concentration:  Concentration: Good  Recall:  Good  Fund of Knowledge: Good  Language: Good  Assets:  Desire for Improvement  ADL's:  Intact  Cognition: WNL  Prognosis:  Good    DIAGNOSES:    ICD-10-CM   1. Attention deficit hyperactivity disorder (ADHD), combined type  F90.2 lisdexamfetamine (  VYVANSE) 40 MG capsule    2. PTSD (post-traumatic stress disorder)  F43.10     3. Generalized anxiety disorder  F41.1     4. Major depressive disorder, recurrent episode, moderate (HCC)  F33.1       Receiving Psychotherapy: yes, Elio Forget   RECOMMENDATIONS:    Greater than 50% of 30 min face to face time with patient was spent on counseling and coordination of care. Discussed his current status with anxiety. He is questioning whether some of his medications are making him less motivated or numb things in life. He will speak to his PCP about testosterone  levels.    We agreed on the following:   To continue Trintellix 20 mg daily To continue Buspar to 15 mg twice daily at least 7-8 hours apart.  Increase Vraylar to 40  mg daily Will report any side effects or worsening symptoms promptly Will follow up in 8 weeks to reassess Provided emergency contact information Discussed potential benefits, risks, and side effects of stimulants with patient to include increased heart rate, palpitations, insomnia, increased anxiety, increased irritability, or decreased appetite.  Instructed patient to contact office if experiencing any significant tolerability issues.  Reviewed PDMP     Joan Flores, NP             Joan Flores, NP

## 2021-12-16 ENCOUNTER — Ambulatory Visit: Admitting: Mental Health

## 2021-12-22 ENCOUNTER — Ambulatory Visit (INDEPENDENT_AMBULATORY_CARE_PROVIDER_SITE_OTHER): Admitting: Mental Health

## 2021-12-22 DIAGNOSIS — F902 Attention-deficit hyperactivity disorder, combined type: Secondary | ICD-10-CM

## 2021-12-22 NOTE — Progress Notes (Addendum)
[facility]  Name: DESHON HSIAO Date:  12/22/21 MRN: [id number] DOB: [birth year] PCP: Bary Leriche, PA-C  Time spent: 51 minutes  Treatment:  individual therapy   Mental Status Exam:    Appearance:    Casual     Behavior:   Appropriate  Motor:   WNL  Speech/Language:    Clear and Coherent  Affect:   Full range   Mood:   pleasant, euthymic  Thought process:   Logical, linear, goal directed  Thought content:     WNL  Sensory/Perceptual disturbances:     none  Orientation:   x4  Attention:   Good  Concentration:   Good  Memory:   Intact  Fund of knowledge:    Consistent with age and development  Insight:     Good  Judgment:    Good  Impulse Control:   Good     Reported Symptoms:  sleep problems (night terrors), irritability, panic attacks, agitation  Risk Assessment: Danger to Self:  No Self-injurious Behavior: No Danger to Others: No Duty to Warn:no Physical Aggression / Violence:No  Access to Firearms a concern: No  Gang Involvement:No  Patient / guardian was educated about steps to take if suicide or homicide risk level increases between visits: yes While future psychiatric events cannot be accurately predicted, the patient does not currently require acute inpatient psychiatric care and does not currently meet [facility] involuntary commitment criteria.  Subjective: Patient arrived on time for today's session.  Assessed progress.  Continues to share work-related stress, continuing to make efforts toward setting some interpersonal boundaries with how much time he spends working versus having downtime at home to try and destress.  Facilitated his identifying how he frames thoughts toward being successful in this effort where he was able to do so in session.  Shared some history related to his work ethic and significant drive and determination to be successful which has created opportunity for being able to grow his career and give himself  more options.  He continues to have challenges working with his current boss, some worry at times whether he has full trust in him however, he continues to work toward reminding himself to focus on his job and seeing projects to completion and how issues that he has with he and some others at the company have with his boss will eventually become even more apparent. Reports caring for himself, getting enough rest utilizing his CPAP daily.  Getting proper rest with the help of his CPAP as needed as he states if he has a night where he forgets to use it, he will feel significantly fatigued the next day and have difficulty with concentration and focus.   Interventions: Further assessment, motivational interviewing   Diagnoses:    ICD-10-CM   1. Attention deficit hyperactivity disorder (ADHD), combined type  F90.2         Plan: Patient is to use CBT, mindfulness and coping skills to help manage /decrease symptoms.  Patient to continue to work to recognize what he can control versus what he cannot in the way of trying to be supportive to those in his life and also meeting his own interpersonal needs to keep his stress and anxiety low.   Long-term goal:  Reduce overall level, frequency, and intensity of the feelings of depression, anxiety and panic so that daily functioning is not impaired.  Short-term goal: To identify and process feelings related to the disappointment of past painful events  Improve ability to relax by utilizing coping skills as discussed        Identify and utilize emotional regulatory skills    Assessment of progress:  progressing     [name] Session, [facility]

## 2021-12-30 ENCOUNTER — Ambulatory Visit: Admitting: Mental Health

## 2022-01-19 ENCOUNTER — Ambulatory Visit: Admitting: Mental Health

## 2022-01-23 ENCOUNTER — Encounter: Payer: Self-pay | Admitting: Behavioral Health

## 2022-01-23 ENCOUNTER — Ambulatory Visit (INDEPENDENT_AMBULATORY_CARE_PROVIDER_SITE_OTHER): Admitting: Behavioral Health

## 2022-01-23 DIAGNOSIS — F902 Attention-deficit hyperactivity disorder, combined type: Secondary | ICD-10-CM | POA: Diagnosis not present

## 2022-01-23 DIAGNOSIS — F411 Generalized anxiety disorder: Secondary | ICD-10-CM

## 2022-01-23 DIAGNOSIS — F4310 Post-traumatic stress disorder, unspecified: Secondary | ICD-10-CM | POA: Diagnosis not present

## 2022-01-23 DIAGNOSIS — F331 Major depressive disorder, recurrent, moderate: Secondary | ICD-10-CM

## 2022-01-23 MED ORDER — AMPHETAMINE-DEXTROAMPHETAMINE 15 MG PO TABS: 15.0000 mg | ORAL_TABLET | Freq: Every day | ORAL | 0 refills | Status: DC

## 2022-01-23 NOTE — Progress Notes (Signed)
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 He continues to say  that he has been doing well overall with anxiety and depression. He is concerned about the continued lack of energy and feeling fatigued all the time. He reports the [place] checked his testosterone and it was 308. He understands this is the low end of normal. He says that he is going to follow up with his PCP for discussion.  He is also feeling like his Vyvanse was not effective in helping with attention and focus. He has been struggling at work. He feel like the IR version of Adderall works better. He is requesting change this visit. He is continuing with therapy and learning to work through some of his stressors on the job. He says his anxiety today is 2/10 and depression is 2/10.  Still endorses periods of irritability and racing thoughts.   He denies mania, no auditory or visual hallucinations. Denies SI or HI. [facility].    Past psychiatric medication trials: Zoloft Lexapro Trintellix  Adderall- [employer] shortage Vyvanse  Individual Medical History/ Review of Systems: Changes? :No   Allergies: Patient has no known allergies.  Current Medications:  Current Outpatient Medications:    amphetamine-dextroamphetamine (ADDERALL) 15 MG tablet, Take 1 tablet by mouth daily., Disp: 90 tablet, Rfl: 0   amLODipine (NORVASC) 5 MG tablet, Take 1 tablet by mouth daily., Disp: , Rfl:    azelastine (ASTELIN) 0.1 % nasal spray, U 1 SPR IEN BID, Disp: , Rfl:    busPIRone (BUSPAR) 15 MG tablet, Take 1 tablet (15 mg total) by mouth 3 (three) times daily., Disp: 90 tablet, Rfl: 3   chlorpheniramine-HYDROcodone (TUSSIONEX PENNKINETIC ER) 10-8 MG/5ML, Take 5 mLs by  mouth at bedtime as needed for cough., Disp: 115 mL, Rfl: 0   fluticasone (FLONASE) 50 MCG/ACT nasal spray, SHAKE LQ AND U 1 TO 2 SPRAYS IEN QD, Disp: , Rfl:    lisdexamfetamine (VYVANSE) 30 MG capsule, Take 1 capsule (30 mg total) by mouth daily., Disp: 30 capsule, Rfl: 0   lisdexamfetamine (VYVANSE) 40 MG capsule, Take 1 capsule (40 mg total) by mouth every morning., Disp: 30 capsule, Rfl: 0   omeprazole (PRILOSEC) 20 MG capsule, take 1 capsule by mouth once daily, Disp: 30 capsule, Rfl: 3   prazosin (MINIPRESS) 1 MG capsule, , Disp: , Rfl:    predniSONE (DELTASONE) 50 MG tablet, Take 1 tablet (50 mg total) by mouth daily., Disp: 3 tablet, Rfl: 0   TRINTELLIX 20 MG TABS tablet, TAKE 1 TABLET(20 MG) BY MOUTH DAILY, Disp: 30 tablet, Rfl: 2 Medication Side Effects: none  Family Medical/ Social History: Changes? No  MENTAL HEALTH EXAM:  There were no vitals taken for this visit.There is no height or weight on file to calculate BMI.  General Appearance: Casual, Neat, and Well Groomed  Eye Contact:  Good  Speech:  Clear and Coherent  Volume:  Normal  Mood:  NA  Affect:  Appropriate  Thought Process:  Coherent  Orientation:  Full (Time, Place, and Person)  Thought Content: Logical   Suicidal Thoughts:  No  Homicidal Thoughts:  No  Memory:  WNL  Judgement:  Good  Insight:  Good  Psychomotor Activity:  Normal  Concentration:  Concentration: Good  Recall:  Good  Fund of Knowledge: Good  Language: Good  Assets:  Desire for Improvement  ADL's:  Intact  Cognition: WNL  Prognosis:  Good    DIAGNOSES:    ICD-10-CM   1. Attention deficit hyperactivity disorder (ADHD), combined type  F90.2 amphetamine-dextroamphetamine (ADDERALL) 15 MG tablet    2. PTSD (post-traumatic stress disorder)  F43.10     3. Generalized anxiety disorder  F41.1     4. Major depressive disorder, recurrent episode, moderate (HCC)  F33.1       Receiving Psychotherapy: No    RECOMMENDATIONS:   Greater  than 50% of 30 min face to face time with patient was spent on counseling and coordination of care. Discussed his current status with anxiety. No changes this visit. He does not want to make any changes to his medications at this time. He will speak to his PCP about testosterone levels. Last Lab he was at 308.  Low end of normal but patients age  and sense of wellbeing must be taken into consideration. Discussed some review demonstrating greater population of Veterans with low T.     We agreed on the following:   To continue Trintellix 20 mg daily To continue Buspar to 15 mg twice daily at least 7-8 hours apart.  Stop Vyvanse To start Adderall 15 mg  IR three times daily as needed Will report any side effects or worsening symptoms promptly Will follow up in 6 weeks to reassess Provided emergency contact information Discussed potential benefits, risks, and side effects of stimulants with patient to include increased heart rate, palpitations, insomnia, increased anxiety, increased irritability, or decreased appetite.  Instructed patient to contact office if experiencing any significant tolerability issues.  Reviewed PDMP     Joan Flores, NP

## 2022-02-17 ENCOUNTER — Ambulatory Visit: Admitting: Mental Health

## 2022-02-20 ENCOUNTER — Encounter: Payer: Self-pay | Admitting: *Deleted

## 2022-03-01 ENCOUNTER — Ambulatory Visit: Admitting: Behavioral Health

## 2022-03-02 ENCOUNTER — Ambulatory Visit (INDEPENDENT_AMBULATORY_CARE_PROVIDER_SITE_OTHER): Admitting: Mental Health

## 2022-03-02 DIAGNOSIS — F331 Major depressive disorder, recurrent, moderate: Secondary | ICD-10-CM | POA: Diagnosis not present

## 2022-03-02 DIAGNOSIS — F902 Attention-deficit hyperactivity disorder, combined type: Secondary | ICD-10-CM | POA: Diagnosis not present

## 2022-03-02 NOTE — Progress Notes (Signed)
[facility]  Name: YOSKAR BILINSKI Date:  03/02/22 MRN: [id number] DOB: [birth year] PCP: Fredirick Lathe, PA-C  Time spent: 51 minutes  Treatment:  individual therapy   Mental Status Exam:    Appearance:    Casual     Behavior:   Appropriate  Motor:   WNL  Speech/Language:    Clear and Coherent  Affect:   Full range   Mood:   pleasant, euthymic  Thought process:   Logical, linear, goal directed  Thought content:     WNL  Sensory/Perceptual disturbances:     none  Orientation:   x4  Attention:   Good  Concentration:   Good  Memory:   Intact  Fund of knowledge:    Consistent with age and development  Insight:     Good  Judgment:    Good  Impulse Control:   Good     Reported Symptoms:  sleep problems (night terrors), irritability, panic attacks, agitation  Risk Assessment: Danger to Self:  No Self-injurious Behavior: No Danger to Others: No Duty to Warn:no Physical Aggression / Violence:No  Access to Firearms a concern: No  Gang Involvement:No  Patient / guardian was educated about steps to take if suicide or homicide risk level increases between visits: yes While future psychiatric events cannot be accurately predicted, the patient does not currently require acute inpatient psychiatric care and does not currently meet [facility] involuntary commitment criteria.  Subjective: Patient arrived on time for today's session.  Assess progress since last visit which was about 3 months ago.  He shared how he has had increased fatigue at work, typically occurring in the early to mid afternoon.  Reports feeling increased anhedonia, has not been working out regularly and has struggled to enjoy activities such as golf and video games.  He stated that he has an appointment with his PCP following today's visit to further look at his blood work.  Explored stressors, he continues to have some stress at work but has been able to maintain successfully, reports his  marital relationship is in a good place.  Through guided discovery he identified his history from childhood related to working to make money, his family struggling financially.  He stated that he always pushed himself to excel and did so while joining the [employer] which also helped pay for college at the time.  He identified difficulty relaxing, we discussed ways to integrate some mindfulness exercises into his daily routine, discussing some in detail in session.      Interventions:   motivational interviewing, CBT, mindfulness strategies   Diagnoses:    ICD-10-CM   1. Attention deficit hyperactivity disorder (ADHD), combined type  F90.2     2. Major depressive disorder, recurrent episode, moderate ([place])  F33.1          Plan: Patient is to use CBT, mindfulness and coping skills to help manage /decrease symptoms.  Patient to continue to work to recognize what he can control versus what he cannot in the way of trying to be supportive to those in his life and also meeting his own interpersonal needs to keep his stress and anxiety low.   Long-term goal:  Reduce overall level, frequency, and intensity of the feelings of depression, anxiety and panic so that daily functioning is not impaired.  Short-term goal: To identify and process feelings related to the disappointment of past painful events  Improve ability to relax by utilizing coping skills as discussed        Identify and utilize emotional regulatory skills    Assessment of progress:  progressing     Anson [place], [facility]

## 2022-03-06 ENCOUNTER — Ambulatory Visit: Admitting: Behavioral Health

## 2022-03-07 ENCOUNTER — Ambulatory Visit (INDEPENDENT_AMBULATORY_CARE_PROVIDER_SITE_OTHER): Admitting: Behavioral Health

## 2022-03-07 ENCOUNTER — Encounter: Payer: Self-pay | Admitting: Behavioral Health

## 2022-03-07 DIAGNOSIS — F902 Attention-deficit hyperactivity disorder, combined type: Secondary | ICD-10-CM | POA: Diagnosis not present

## 2022-03-07 DIAGNOSIS — F330 Major depressive disorder, recurrent, mild: Secondary | ICD-10-CM

## 2022-03-07 DIAGNOSIS — F411 Generalized anxiety disorder: Secondary | ICD-10-CM | POA: Diagnosis not present

## 2022-03-07 DIAGNOSIS — F4310 Post-traumatic stress disorder, unspecified: Secondary | ICD-10-CM | POA: Diagnosis not present

## 2022-03-07 MED ORDER — AMPHETAMINE-DEXTROAMPHETAMINE 15 MG PO TABS
15.0000 mg | ORAL_TABLET | Freq: Every day | ORAL | 0 refills | Status: DC
Start: 2022-03-07 — End: 2022-05-02

## 2022-03-07 MED ORDER — VORTIOXETINE HBR 20 MG PO TABS: ORAL_TABLET | ORAL | 3 refills | Status: DC

## 2022-03-07 NOTE — Progress Notes (Deleted)
[facility]  Patient ID: Joshua Fry,  MRN: [id number]  PCP: Fredirick Lathe, PA-C  Date of Evaluation: 03/07/2022 Time spent:30 minutes  Chief Complaint:   HISTORY/CURRENT STATUS: HPI  Individual Medical History/ Review of Systems: Changes? :No   Allergies: Patient has no known allergies.  Current Medications:  Current Outpatient Medications:    amLODipine (NORVASC) 5 MG tablet, Take 1 tablet by mouth daily., Disp: , Rfl:    amphetamine-dextroamphetamine (ADDERALL) 15 MG tablet, Take 1 tablet by mouth daily., Disp: 90 tablet, Rfl: 0   azelastine (ASTELIN) 0.1 % nasal spray, U 1 SPR IEN BID, Disp: , Rfl:    busPIRone (BUSPAR) 15 MG tablet, Take 1 tablet (15 mg total) by mouth 3 (three) times daily., Disp: 90 tablet, Rfl: 3   chlorpheniramine-HYDROcodone (TUSSIONEX PENNKINETIC ER) 10-8 MG/5ML, Take 5 mLs by mouth at bedtime as needed for cough., Disp: 115 mL, Rfl: 0   fluticasone (FLONASE) 50 MCG/ACT nasal spray, SHAKE LQ AND U 1 TO 2 SPRAYS IEN QD, Disp: , Rfl:    omeprazole (PRILOSEC) 20 MG capsule, take 1 capsule by mouth once daily, Disp: 30 capsule, Rfl: 3   prazosin (MINIPRESS) 1 MG capsule, , Disp: , Rfl:    predniSONE (DELTASONE) 50 MG tablet, Take 1 tablet (50 mg total) by mouth daily., Disp: 3 tablet, Rfl: 0   TRINTELLIX 20 MG TABS tablet, TAKE 1 TABLET(20 MG) BY MOUTH DAILY, Disp: 30 tablet, Rfl: 2 Medication Side Effects: none  Family Medical/ Social History: Changes? No  MENTAL HEALTH EXAM:  There were no vitals taken for this visit.There is no height or weight on file to calculate BMI.  General Appearance: Casual, Neat, and Well Groomed  Eye Contact:  Good  Speech:  Clear and Coherent  Volume:  Normal  Mood:  Anxious, Depressed, and Dysphoric  Affect:  Appropriate  Thought Process:  Coherent  Orientation:  Full (Time, Place, and Person)  Thought Content: Logical   Suicidal Thoughts:  No  Homicidal Thoughts:  No  Memory:  WNL   Judgement:  Good  Insight:  Good  Psychomotor Activity:  Normal  Concentration:  Concentration: Good  Recall:  Good  Fund of Knowledge: Good  Language: Good  Assets:  Desire for Improvement  ADL's:  Intact  Cognition: WNL  Prognosis:  Good    DIAGNOSES:    ICD-10-CM   1. Mild episode of recurrent major depressive disorder ([place])  F33.0     2. Attention deficit hyperactivity disorder (ADHD), combined type  F90.2     3. Generalized anxiety disorder  F41.1     4. PTSD (post-traumatic stress disorder)  F43.10       Receiving Psychotherapy: No    RECOMMENDATIONS:           Elwanda [place], NP

## 2022-03-07 NOTE — Progress Notes (Signed)
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 He continues to say  that he has been doing well overall with anxiety and depression. He is still experiencing lack of energy but says his B12 was low. Says his PCP will work on that.  He says he know he needs to get back in the gym because it will make him feel better.   Says his work situation has improved. Boss gave him 11% raise. He feel like the IR version of Adderall works better.  He says his anxiety today is 2/10 and depression is 3/10. Requesting no medication changes today.    He denies mania, no auditory or visual hallucinations. Denies SI or HI. [facility].    Past psychiatric medication trials: Zoloft Lexapro Trintellix  Adderall- [employer] shortage Vyvanse      Individual Medical History/ Review of Systems: Changes? :No   Allergies: Patient has no known allergies.  Current Medications:  Current Outpatient Medications:    amLODipine (NORVASC) 5 MG tablet, Take 1 tablet by mouth daily., Disp: , Rfl:    amphetamine-dextroamphetamine (ADDERALL) 15 MG tablet, Take 1 tablet by mouth daily., Disp: 90 tablet, Rfl: 0   azelastine (ASTELIN) 0.1 % nasal spray, U 1 SPR IEN BID, Disp: , Rfl:    busPIRone (BUSPAR) 15 MG tablet, Take 1 tablet (15 mg total) by mouth 3 (three) times daily., Disp: 90 tablet, Rfl: 3   chlorpheniramine-HYDROcodone (TUSSIONEX PENNKINETIC ER) 10-8 MG/5ML, Take 5 mLs by mouth at bedtime as needed for cough., Disp: 115 mL, Rfl: 0   fluticasone (FLONASE) 50 MCG/ACT nasal spray, SHAKE LQ AND U 1 TO 2 SPRAYS IEN QD,  Disp: , Rfl:    omeprazole (PRILOSEC) 20 MG capsule, take 1 capsule by mouth once daily, Disp: 30 capsule, Rfl: 3   prazosin (MINIPRESS) 1 MG capsule, , Disp: , Rfl:    predniSONE (DELTASONE) 50 MG tablet, Take 1 tablet (50 mg total) by mouth daily., Disp: 3 tablet, Rfl: 0   TRINTELLIX 20 MG TABS tablet, TAKE 1 TABLET(20 MG) BY MOUTH DAILY, Disp: 30 tablet, Rfl: 2 Medication Side Effects: none  Family Medical/ Social History: Changes? No  MENTAL HEALTH EXAM:  There were no vitals taken for this visit.There is no height or weight on file to calculate BMI.  General Appearance: Casual, Neat, and Well Groomed  Eye Contact:  Good  Speech:  Clear and Coherent  Volume:  Normal  Mood:  Anxious, Depressed, and Dysphoric  Affect:  Depressed and Anxious  Thought Process:  Coherent  Orientation:  Full (Time, Place, and Person)  Thought Content: Logical   Suicidal Thoughts:  No  Homicidal Thoughts:  No  Memory:  WNL  Judgement:  Good  Insight:  Good  Psychomotor Activity:  Normal  Concentration:  Concentration: Good  Recall:  Good  Fund of Knowledge: Good  Language: Good  Assets:  Desire for Improvement  ADL's:  Intact  Cognition: WNL  Prognosis:  Good    DIAGNOSES:    ICD-10-CM   1. Mild episode of recurrent major depressive disorder (HCC)  F33.0     2. Attention deficit  hyperactivity disorder (ADHD), combined type  F90.2     3. Generalized anxiety disorder  F41.1     4. PTSD (post-traumatic stress disorder)  F43.10       Receiving Psychotherapy: No    RECOMMENDATIONS:   Greater than 50% of 30 min face to face time with patient was spent on counseling and coordination of care. Discussed his current status with anxiety. No changes this visit. He does not want to make any changes to his medications at this time.    We agreed on the following: To continue Trintellix 20 mg daily Continue Adderall 15 mg  IR three times daily as needed Will report any side effects or worsening  symptoms promptly Will follow up in 6 weeks to reassess Provided emergency contact information Discussed potential benefits, risks, and side effects of stimulants with patient to include increased heart rate, palpitations, insomnia, increased anxiety, increased irritability, or decreased appetite.  Instructed patient to contact office if experiencing any significant tolerability issues.  Reviewed PDMP         Joan Flores, NP

## 2022-03-08 ENCOUNTER — Telehealth: Payer: Self-pay | Admitting: Behavioral Health

## 2022-03-08 ENCOUNTER — Other Ambulatory Visit: Payer: Self-pay

## 2022-03-08 MED ORDER — AMPHETAMINE-DEXTROAMPHETAMINE 5 MG PO TABS: 5.0000 mg | ORAL_TABLET | Freq: Three times a day (TID) | ORAL | 0 refills | Status: DC | PRN

## 2022-03-08 MED ORDER — AMPHETAMINE-DEXTROAMPHETAMINE 10 MG PO TABS: 10.0000 mg | ORAL_TABLET | Freq: Three times a day (TID) | ORAL | 0 refills | Status: DC | PRN

## 2022-03-08 NOTE — Telephone Encounter (Signed)
Pt reporting [facility] out of 15 mg generic adderall. They do have 10 mg and 5 mg in stock. Please send as 5 mg and 10 mg.

## 2022-03-08 NOTE — Telephone Encounter (Signed)
Pended.

## 2022-03-09 NOTE — Telephone Encounter (Signed)
Joshua Fry at [facility] in [place] LVM at 8:41a.  She said they got the script for the 10mg  and 5mg  Adderall, but it says it's to be taken 3 times a day versus the previous script said 1 time a day. Pls advise if this is correct.  Next appt 1/9

## 2022-03-09 NOTE — Telephone Encounter (Signed)
When I pended them I thought I put take 1 daily for both.Did you change it?I just want to make sure I didn't make a mistake so that I can fix it.

## 2022-03-09 NOTE — Telephone Encounter (Signed)
Spoke to pharmacy and clarified 

## 2022-03-09 NOTE — Telephone Encounter (Signed)
I changed it. The note read wrong, he was on 15 mg three times daily. So I had to change the amount.

## 2022-03-28 ENCOUNTER — Ambulatory Visit (INDEPENDENT_AMBULATORY_CARE_PROVIDER_SITE_OTHER): Admitting: Mental Health

## 2022-03-28 DIAGNOSIS — F902 Attention-deficit hyperactivity disorder, combined type: Secondary | ICD-10-CM | POA: Diagnosis not present

## 2022-03-28 DIAGNOSIS — F411 Generalized anxiety disorder: Secondary | ICD-10-CM

## 2022-03-28 NOTE — Progress Notes (Signed)
[facility]  Name: Joshua Fry Date:  03/28/22 MRN: [id number] DOB: Sep 17, 1985 PCP: Fredirick Lathe, PA-C  Time spent: 52 minutes  Treatment:  individual therapy   Mental Status Exam:    Appearance:    Casual     Behavior:   Appropriate  Motor:   WNL  Speech/Language:    Clear and Coherent  Affect:   Full range   Mood:   pleasant, euthymic  Thought process:   Logical, linear, goal directed  Thought content:     WNL  Sensory/Perceptual disturbances:     none  Orientation:   x4  Attention:   Good  Concentration:   Good  Memory:   Intact  Fund of knowledge:    Consistent with age and development  Insight:     Good  Judgment:    Good  Impulse Control:   Good     Reported Symptoms:  sleep problems (night terrors), irritability, panic attacks, agitation  Risk Assessment: Danger to Self:  No Self-injurious Behavior: No Danger to Others: No Duty to Warn:no Physical Aggression / Violence:No  Access to Firearms a concern: No  Gang Involvement:No  Patient / guardian was educated about steps to take if suicide or homicide risk level increases between visits: yes While future psychiatric events cannot be accurately predicted, the patient does not currently require acute inpatient psychiatric care and does not currently meet [facility] involuntary commitment criteria.  Subjective: Patient arrived on time for today's session.  Patient shared recent events.  Reports low motivation, is keeping up with work tasks which are many as his job as demanding.  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  He said he has been somewhat inconsistent with his workout routine and identified the need to keep a weekly schedule.  We discussed some cognitive behavioral strategies, thought stopping, start behaviors toward reestablishing this routine and habit.  He stated that he is getting adequate sleep, uses a CPAP machine which is effective, reports the struggle with  motivation at times in the evening as his job often demands and working beyond full-time hours.  He stated he has done so for the past several years and questions where his motivation has gone.  Feels that he has a sense of burnout with his job but overall feels that he is at the right company and is good at what he does for a living.  Facilitated his identifying needs, areas of change.    Interventions:   motivational interviewing, CBT, mindfulness strategies   Diagnoses:    ICD-10-CM   1. Attention deficit hyperactivity disorder (ADHD), combined type  F90.2     2. Generalized anxiety disorder  F41.1           Plan: Patient is to use CBT, mindfulness and coping skills to help manage /decrease symptoms.  Patient to continue to work to recognize what he can control versus what he cannot in the way of trying to be supportive to those in his life and also meeting his own interpersonal needs to keep his stress and anxiety low.   Long-term goal:  Reduce overall level, frequency, and intensity of the feelings of depression, anxiety and panic so that daily functioning is not impaired.  Short-term goal: To identify and process feelings related to the disappointment of past painful events                    Improve ability to relax by utilizing coping skills as discussed  Identify and utilize emotional regulatory skills    Assessment of progress:  progressing     [name] Session, [facility]

## 2022-04-11 ENCOUNTER — Ambulatory Visit (INDEPENDENT_AMBULATORY_CARE_PROVIDER_SITE_OTHER): Admitting: Mental Health

## 2022-04-11 DIAGNOSIS — F411 Generalized anxiety disorder: Secondary | ICD-10-CM | POA: Diagnosis not present

## 2022-04-11 NOTE — Progress Notes (Signed)
[facility]  Name: Joshua Fry Date:  04/11/22 MRN: [id number] DOB: 04-16-1986 PCP: Bary Leriche, PA-C  Time spent: 52 minutes  Treatment:  individual therapy   Mental Status Exam:    Appearance:    Casual     Behavior:   Appropriate  Motor:   WNL  Speech/Language:    Clear and Coherent  Affect:   Full range   Mood:   anxious, pleasant  Thought process:   Logical, linear, goal directed  Thought content:     WNL  Sensory/Perceptual disturbances:     none  Orientation:   x4  Attention:   Good  Concentration:   Good  Memory:   Intact  Fund of knowledge:    Consistent with age and development  Insight:     Good  Judgment:    Good  Impulse Control:   Good     Reported Symptoms:  sleep problems (night terrors), irritability, panic attacks, agitation  Risk Assessment: Danger to Self:  No Self-injurious Behavior: No Danger to Others: No Duty to Warn:no Physical Aggression / Violence:No  Access to Firearms a concern: No  Gang Involvement:No  Patient / guardian was educated about steps to take if suicide or homicide risk level increases between visits: yes While future psychiatric events cannot be accurately predicted, the patient does not currently require acute inpatient psychiatric care and does not currently meet [facility] involuntary commitment criteria.  Subjective: Patient arrived on time for today's session.  Patient shared recent events.  Patient shared progress. He said that his wife's mother is ill, cooking with cancer and treatment. He went on to share details related to her current progress, concerns that her condition is worsening. He tries to be supportive to his wife during this time, is recalling of losing his grandfather and childhood due to cancer over battling it for a few years. He has concerns about his wife possibly having the cancer gene and continues to encourage her to get testing. Through guided Discovery, hidden find  the name too be supportive of her and making the decision and living it up to her ultimately. He continues to have a sense of burnout at work currently, continues to think about his past experiences serving in [employer], feels often a sense of having a "void" somewhat in his life where he may have a longing to be back in the [employer] I'll be at he knows that this is not possible due to his medical conditions. Identify a sense of purpose and we encourage him to consider how he defined his purpose during that time and presently.    Interventions:   motivational interviewing, CBT, mindfulness strategies   Diagnoses:    ICD-10-CM   1. Generalized anxiety disorder  F41.1            Plan: Patient is to use CBT, mindfulness and coping skills to help manage /decrease symptoms.  Patient to continue to work to recognize what he can control versus what he cannot in the way of trying to be supportive to those in his life and also meeting his own interpersonal needs to keep his stress and anxiety low.   Long-term goal:  Reduce overall level, frequency, and intensity of the feelings of depression, anxiety and panic so that daily functioning is not impaired.  Short-term goal: To identify and process feelings related to the disappointment of past painful events  Improve ability to relax by utilizing coping skills as discussed        Identify and utilize emotional regulatory skills    Assessment of progress:  progressing     Anson [place], [facility]

## 2022-04-25 ENCOUNTER — Ambulatory Visit (INDEPENDENT_AMBULATORY_CARE_PROVIDER_SITE_OTHER): Admitting: Mental Health

## 2022-04-25 DIAGNOSIS — F902 Attention-deficit hyperactivity disorder, combined type: Secondary | ICD-10-CM

## 2022-04-25 NOTE — Progress Notes (Signed)
[facility] note  Name: AVIR DERUITER Date:  04/25/22 MRN: [id number] DOB: Nov 24, 1985 PCP: Bary Leriche, PA-C  Time spent: 51 minutes  Treatment:  individual therapy   Mental Status Exam:    Appearance:    Casual     Behavior:   Appropriate  Motor:   WNL  Speech/Language:    Clear and Coherent  Affect:   Full range   Mood:   Euthymic, pleasant  Thought process:   Logical, linear, goal directed  Thought content:     WNL  Sensory/Perceptual disturbances:     none  Orientation:   x4  Attention:   Good  Concentration:   Good  Memory:   Intact  Fund of knowledge:    Consistent with age and development  Insight:     Good  Judgment:    Good  Impulse Control:   Good     Reported Symptoms:  sleep problems (night terrors), irritability, panic attacks, agitation  Risk Assessment: Danger to Self:  No Self-injurious Behavior: No Danger to Others: No Duty to Warn:no Physical Aggression / Violence:No  Access to Firearms a concern: No  Gang Involvement:No  Patient / guardian was educated about steps to take if suicide or homicide risk level increases between visits: yes While future psychiatric events cannot be accurately predicted, the patient does not currently require acute inpatient psychiatric care and does not currently meet [facility] involuntary commitment criteria.  Subjective: Patient arrived on time for today's session.  Patient shared recent events in progress.  He shared how he continues to feel a sense of burnout at work.  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  He shared how he has a tendency to try to handle many tasks to ensure there is adequate follow through but plans to draw limits in this area.  He wants to relieve a sense of burnout he has been feeling over the past couple of months.  Through further guided discovery, he identified the need to take a  vacation between Christmas and New Year's as he typically does not take a vacation.  Facilitated how he would like to spend his time while maintaining a boundary to avoid engaging in work.  Interventions:   motivational interviewing, CBT, mindfulness strategies   Diagnoses:    ICD-10-CM   1. Attention deficit hyperactivity disorder (ADHD), combined type  F90.2             Plan: Patient is to use CBT, mindfulness and coping skills to help manage /decrease symptoms.  Patient to continue to work to recognize what he can control versus what he cannot in the way of trying to be supportive to those in his life and also meeting his own interpersonal needs to keep his stress and anxiety low.   Long-term goal:  Reduce overall level, frequency, and intensity of the feelings of depression, anxiety and panic so that daily functioning is not impaired.  Short-term goal: To identify and process feelings related to the disappointment of past painful events                    Improve ability to relax by utilizing coping skills as discussed        Identify and utilize emotional regulatory skills    Assessment of progress:  progressing     [name] Session, [facility]

## 2022-05-02 ENCOUNTER — Telehealth: Payer: Self-pay | Admitting: Behavioral Health

## 2022-05-02 ENCOUNTER — Other Ambulatory Visit: Payer: Self-pay

## 2022-05-02 DIAGNOSIS — F902 Attention-deficit hyperactivity disorder, combined type: Secondary | ICD-10-CM

## 2022-05-02 MED ORDER — AMPHETAMINE-DEXTROAMPHETAMINE 15 MG PO TABS
15.0000 mg | ORAL_TABLET | Freq: Every day | ORAL | 0 refills | Status: DC
Start: 2022-05-02 — End: 2022-06-16

## 2022-05-02 NOTE — Telephone Encounter (Signed)
Joshua Fry requesting a refill on the Adderall. Patient has a follow up scheduled for 06/06/22  Contact information # [phone]

## 2022-05-02 NOTE — Telephone Encounter (Signed)
Pended.

## 2022-05-11 ENCOUNTER — Encounter: Payer: Self-pay | Admitting: *Deleted

## 2022-06-06 ENCOUNTER — Ambulatory Visit: Admitting: Mental Health

## 2022-06-06 ENCOUNTER — Ambulatory Visit: Admitting: Behavioral Health

## 2022-06-15 ENCOUNTER — Ambulatory Visit: Admitting: Behavioral Health

## 2022-06-16 ENCOUNTER — Ambulatory Visit (INDEPENDENT_AMBULATORY_CARE_PROVIDER_SITE_OTHER): Admitting: Behavioral Health

## 2022-06-16 ENCOUNTER — Encounter: Payer: Self-pay | Admitting: Behavioral Health

## 2022-06-16 DIAGNOSIS — F331 Major depressive disorder, recurrent, moderate: Secondary | ICD-10-CM | POA: Diagnosis not present

## 2022-06-16 DIAGNOSIS — F902 Attention-deficit hyperactivity disorder, combined type: Secondary | ICD-10-CM | POA: Diagnosis not present

## 2022-06-16 DIAGNOSIS — F411 Generalized anxiety disorder: Secondary | ICD-10-CM

## 2022-06-16 DIAGNOSIS — F4310 Post-traumatic stress disorder, unspecified: Secondary | ICD-10-CM

## 2022-06-16 MED ORDER — BUSPIRONE HCL 15 MG PO TABS: 15.0000 mg | ORAL_TABLET | Freq: Two times a day (BID) | ORAL | 3 refills | Status: DC

## 2022-06-16 MED ORDER — AMPHETAMINE-DEXTROAMPHETAMINE 15 MG PO TABS
15.0000 mg | ORAL_TABLET | Freq: Three times a day (TID) | ORAL | 0 refills | Status: DC
Start: 2022-06-16 — End: 2022-08-08

## 2022-06-16 MED ORDER — VORTIOXETINE HBR 20 MG PO TABS
ORAL_TABLET | ORAL | 1 refills | Status: DC
Start: 2022-06-16 — End: 2022-12-28

## 2022-06-16 NOTE — Progress Notes (Signed)
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  No changes this visit. He continues to say  that he has been doing well overall with anxiety and depression. He is still experiencing lack of energy but says his B12 was low.  He wants to diet and exercise to see I this helps first.  Says his work situation has improved.  He says his anxiety today is 2/10 and depression is 3/10. Requesting no medication changes today.    He denies mania, no auditory or visual hallucinations. Denies SI or HI. [facility].    Past psychiatric medication trials: Zoloft Lexapro Trintellix  Adderall- [employer] shortage Vyvanse       Individual Medical History/ Review of Systems: Changes? :No   Allergies: Patient has no known allergies.  Current Medications:  Current Outpatient Medications:    amLODipine (NORVASC) 5 MG tablet, Take 1 tablet by mouth daily., Disp: , Rfl:    amphetamine-dextroamphetamine (ADDERALL) 10 MG tablet, Take 1 tablet (10 mg total) by mouth 3 (three) times daily as needed. Take with 5 mg tab to equal 15 mg three times daily, Disp: 90 tablet, Rfl: 0   amphetamine-dextroamphetamine (ADDERALL) 15 MG tablet, Take 1 tablet by mouth 3 (three) times daily., Disp: 90 tablet, Rfl: 0   amphetamine-dextroamphetamine (ADDERALL) 5 MG tablet, Take 1 tablet (5 mg total) by mouth 3 (three) times daily as needed. Take with 10 mg tab to equal 15 mg three times daily, Disp: 90 tablet, Rfl: 0   azelastine (ASTELIN) 0.1 % nasal spray, U 1 SPR IEN BID, Disp: , Rfl:    busPIRone (BUSPAR) 15 MG tablet, Take 1 tablet (15 mg total) by mouth 2  (two) times daily., Disp: 60 tablet, Rfl: 3   chlorpheniramine-HYDROcodone (TUSSIONEX PENNKINETIC ER) 10-8 MG/5ML, Take 5 mLs by mouth at bedtime as needed for cough., Disp: 115 mL, Rfl: 0   fluticasone (FLONASE) 50 MCG/ACT nasal spray, SHAKE LQ AND U 1 TO 2 SPRAYS IEN QD, Disp: , Rfl:    omeprazole (PRILOSEC) 20 MG capsule, take 1 capsule by mouth once daily, Disp: 30 capsule, Rfl: 3   predniSONE (DELTASONE) 50 MG tablet, Take 1 tablet (50 mg total) by mouth daily., Disp: 3 tablet, Rfl: 0   vortioxetine HBr (TRINTELLIX) 20 MG TABS tablet, TAKE 1 TABLET(20 MG) BY MOUTH DAILY, Disp: 90 tablet, Rfl: 1 Medication Side Effects: none  Family Medical/ Social History: Changes? No  MENTAL HEALTH EXAM:  There were no vitals taken for this visit.There is no height or weight on file to calculate BMI.  General Appearance: Casual, Neat, and Well Groomed  Eye Contact:  Good  Speech:  Clear and Coherent  Volume:  Normal  Mood:  Depressed  Affect:  Appropriate  Thought Process:  Coherent  Orientation:  Full (Time, Place, and Person)  Thought Content: Logical   Suicidal Thoughts:  No  Homicidal Thoughts:  No  Memory:  WNL  Judgement:  Good  Insight:  Good  Psychomotor Activity:  Normal  Concentration:  Concentration: Good  Recall:  Good  Fund of Knowledge: Good  Language: Good  Assets:  Desire for Improvement  ADL's:  Intact  Cognition: WNL  Prognosis:  Good    DIAGNOSES:    ICD-10-CM   1. Attention deficit hyperactivity disorder (ADHD), combined type  F90.2 amphetamine-dextroamphetamine (ADDERALL) 15 MG tablet    2. Major depressive disorder, recurrent episode, moderate (HCC)  F33.1 busPIRone (BUSPAR) 15 MG tablet    vortioxetine HBr (TRINTELLIX) 20 MG TABS tablet    3. Generalized anxiety disorder  F41.1 busPIRone (BUSPAR) 15 MG tablet    vortioxetine HBr (TRINTELLIX) 20 MG TABS tablet    4. PTSD (post-traumatic stress disorder)  F43.10 busPIRone (BUSPAR) 15 MG tablet       Receiving Psychotherapy: No    RECOMMENDATIONS:   Greater than 50% of 30 min face to face time with patient was spent on counseling and coordination of care. Discussed his current level of stability.  No changes this visit. He does not want to make any changes to his medications at this time.    We agreed on the following: To continue Trintellix 20 mg daily Continue Adderall 15 mg  IR three times daily as needed Will report any side effects or worsening symptoms promptly Will follow up in 12 weeks to reassess Provided emergency contact information Discussed potential benefits, risks, and side effects of stimulants with patient to include increased heart rate, palpitations, insomnia, increased anxiety, increased irritability, or decreased appetite.  Instructed patient to contact office if experiencing any significant tolerability issues.  Reviewed PDMP      Elwanda Brooklyn, NP

## 2022-06-20 ENCOUNTER — Ambulatory Visit: Admitting: Mental Health

## 2022-07-04 ENCOUNTER — Ambulatory Visit (INDEPENDENT_AMBULATORY_CARE_PROVIDER_SITE_OTHER): Admitting: Mental Health

## 2022-07-04 DIAGNOSIS — F902 Attention-deficit hyperactivity disorder, combined type: Secondary | ICD-10-CM | POA: Diagnosis not present

## 2022-07-04 NOTE — Progress Notes (Signed)
[facility] note  Name: Joshua Fry Date:  07/04/22 MRN: [id number] DOB: 04-26-1986 PCP: Fredirick Lathe, PA-C  Time spent: 50 minutes  Treatment:  individual therapy   Mental Status Exam:    Appearance:    Casual     Behavior:   Appropriate  Motor:   WNL  Speech/Language:    Clear and Coherent  Affect:   Full range   Mood:   Euthymic, pleasant  Thought process:   Logical, linear, goal directed  Thought content:     WNL  Sensory/Perceptual disturbances:     none  Orientation:   x4  Attention:   Good  Concentration:   Good  Memory:   Intact  Fund of knowledge:    Consistent with age and development  Insight:     Good  Judgment:    Good  Impulse Control:   Good     Reported Symptoms:  sleep problems (night terrors), irritability, panic attacks, agitation  Risk Assessment: Danger to Self:  No Self-injurious Behavior: No Danger to Others: No Duty to Warn:no Physical Aggression / Violence:No  Access to Firearms a concern: No  Gang Involvement:No  Patient / guardian was educated about steps to take if suicide or homicide risk level increases between visits: yes While future psychiatric events cannot be accurately predicted, the patient does not currently require acute inpatient psychiatric care and does not currently meet [facility] involuntary commitment criteria.  Subjective: Patient arrived on time for today's session.  Patient shared events and progress since last visit which was about 2 months ago.  He stated that he was able to take a week off work in December which was helpful to try and destress.  He stated that he identified how he was going to try to improve his work life balance as he continued to feel a sense of burnout at work due to his multiple responsibilities.  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  He stated he continues to struggle with his tendency to be overly assertive and completing tasks while  also acknowledging his job has been highly demanding over the past few years and has required him to be diligent and effective.  Ways to cope and care for himself, engaging in diaphragmatic breathing with mindfulness concepts which were discussed.  Facilitated his identifying associated thoughts the pressure he was on himself to work with him to reframe.   Interventions:   motivational interviewing, CBT, mindfulness strategies   Diagnoses:    ICD-10-CM   1. Attention deficit hyperactivity disorder (ADHD), combined type  F90.2         Plan: Patient is to use CBT, mindfulness and coping skills to help manage /decrease symptoms.  Patient to continue to work to recognize what he can control versus what he cannot in the way of trying to be supportive to those in his life and also meeting his own interpersonal needs to keep his stress and anxiety low.   Long-term goal:  Reduce overall level, frequency, and intensity of the feelings of depression, anxiety and panic so that daily functioning is not impaired.  Short-term goal: To identify and process feelings related to the disappointment of past painful events                    Improve ability to relax by utilizing coping skills as discussed        Identify and utilize emotional regulatory skills  Assessment of progress:  progressing     Anson [place], [facility]

## 2022-07-26 ENCOUNTER — Ambulatory Visit: Admitting: Mental Health

## 2022-08-08 ENCOUNTER — Other Ambulatory Visit: Payer: Self-pay | Admitting: Behavioral Health

## 2022-08-08 ENCOUNTER — Telehealth: Payer: Self-pay | Admitting: Behavioral Health

## 2022-08-08 ENCOUNTER — Ambulatory Visit (INDEPENDENT_AMBULATORY_CARE_PROVIDER_SITE_OTHER): Admitting: Mental Health

## 2022-08-08 DIAGNOSIS — F330 Major depressive disorder, recurrent, mild: Secondary | ICD-10-CM

## 2022-08-08 DIAGNOSIS — F902 Attention-deficit hyperactivity disorder, combined type: Secondary | ICD-10-CM | POA: Diagnosis not present

## 2022-08-08 MED ORDER — AMPHETAMINE-DEXTROAMPHETAMINE 15 MG PO TABS: 15.0000 mg | ORAL_TABLET | Freq: Three times a day (TID) | ORAL | 0 refills | Status: DC

## 2022-08-08 NOTE — Telephone Encounter (Signed)
Pt was in to see chris today. He needs a refill on his adderall 15 mg. Pharmacy is [facility] on [address] in [place]

## 2022-08-08 NOTE — Progress Notes (Signed)
[facility] note  Name: Joshua Fry Date:  08/10/22 MRN: [id number] DOB: May 07, 1986 PCP: Joshua Lathe, PA-C  Time spent: 50 minutes  Treatment:  individual therapy   Mental Status Exam:    Appearance:    Casual     Behavior:   Appropriate  Motor:   WNL  Speech/Language:    Clear and Coherent  Affect:   Full range   Mood:   Euthymic, pleasant  Thought process:   Logical, linear, goal directed  Thought content:     WNL  Sensory/Perceptual disturbances:     none  Orientation:   x4  Attention:   Good  Concentration:   Good  Memory:   Intact  Fund of knowledge:    Consistent with age and development  Insight:     Good  Judgment:    Good  Impulse Control:   Good     Reported Symptoms:  sleep problems (night terrors), irritability, panic attacks, agitation  Risk Assessment: Danger to Self:  No Self-injurious Behavior: No Danger to Others: No Duty to Warn:no Physical Aggression / Violence:No  Access to Firearms a concern: No  Gang Involvement:No  Patient / guardian was educated about steps to take if suicide or homicide risk level increases between visits: yes While future psychiatric events cannot be accurately predicted, the patient does not currently require acute inpatient psychiatric care and does not currently meet Joshua Fry involuntary commitment criteria.  Subjective: Patient arrived on time for today's session.  Assessed progress since last visit where patient stated that he is had some improvement with managing stress related to work and feeling less of a sense of burnout.  He stated that he does have his "days" where he feels more stressed and that sense of burnout returns.  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  He identified how he values his work opportunity as he stated  it is fulfilling, financially rewarding but wants to keep this mindset more frequent and explored some ways to follow through such as practicing gratitude.  Some examples were given in session.  Also, facilitated patient further identifying ways to destress, maintaining a boundary from work and off time more consistently.    Interventions:   motivational interviewing, CBT, mindfulness strategies   Diagnoses:    ICD-10-CM   1. Attention deficit hyperactivity disorder (ADHD), combined type  F90.2     2. Mild episode of recurrent major depressive disorder ([place])  F33.0          Plan: Patient is to use CBT, mindfulness and coping skills to help manage /decrease symptoms.  Patient to continue to work to recognize what he can control versus what he cannot in the way of trying to be supportive to those in his life and also meeting his own interpersonal needs to keep his stress and anxiety low.   Long-term goal:  Reduce overall level, frequency, and intensity of the feelings of depression, anxiety and panic so that daily functioning is not impaired.  Short-term goal: To identify and process feelings related to the disappointment of past painful events                    Improve ability to relax by utilizing coping skills as discussed        Identify and utilize  emotional regulatory skills    Assessment of progress:  progressing     Anson [place], [facility]

## 2022-08-22 ENCOUNTER — Ambulatory Visit: Admitting: Mental Health

## 2022-08-22 DIAGNOSIS — F411 Generalized anxiety disorder: Secondary | ICD-10-CM

## 2022-08-22 DIAGNOSIS — F902 Attention-deficit hyperactivity disorder, combined type: Secondary | ICD-10-CM

## 2022-08-22 NOTE — Progress Notes (Signed)
[facility] note  Name: VAHN HERNANDEZGARCI Date:  08/22/22 MRN: [id number] DOB: 22-Sep-1985 PCP: Fredirick Lathe, PA-C  Time spent: 51 minutes  Treatment:  individual therapy   Mental Status Exam:    Appearance:    Casual     Behavior:   Appropriate  Motor:   WNL  Speech/Language:    Clear and Coherent  Affect:   Full range   Mood:   Euthymic, pleasant  Thought process:   Logical, linear, goal directed  Thought content:     WNL  Sensory/Perceptual disturbances:     none  Orientation:   x4  Attention:   Good  Concentration:   Good  Memory:   Intact  Fund of knowledge:    Consistent with age and development  Insight:     Good  Judgment:    Good  Impulse Control:   Good     Reported Symptoms:  sleep problems (night terrors), irritability, panic attacks, agitation  Risk Assessment: Danger to Self:  No Self-injurious Behavior: No Danger to Others: No Duty to Warn:no Physical Aggression / Violence:No  Access to Firearms a concern: No  Gang Involvement:No  Patient / guardian was educated about steps to take if suicide or homicide risk level increases between visits: yes While future psychiatric events cannot be accurately predicted, the patient does not currently require acute inpatient psychiatric care and does not currently meet [facility] involuntary commitment criteria.  Subjective: Patient arrived on time for today's session.  Assessed recent events, progress.  He shared ongoing work-related stress.  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  We continue to explore ways he sets boundaries from work and home.  He stated that he continues to try and keep the separation intact, finds himself often not wanting to talk a lot when he gets some, needing downtime.  Continue to work with patient from a cognitive behavioral framework, facilitating his framing needs toward keeping stress low.  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  Assess his marital relationship which he states is going well, his wife continues to take college coursework.    Interventions:   motivational interviewing, CBT, mindfulness strategies   Diagnoses:    ICD-10-CM   1. Generalized anxiety disorder  F41.1     2. Attention deficit hyperactivity disorder (ADHD), combined type  F90.2           Plan: Patient is to use CBT, mindfulness and coping skills to help manage /decrease symptoms.  Patient to continue to work to recognize what he can control versus what he cannot in the way of trying to be supportive to those in his life and also meeting his own interpersonal needs to keep his stress and anxiety low.   Long-term goal:  Reduce overall level, frequency, and intensity of the feelings of depression, anxiety and panic so that daily functioning is not impaired.  Short-term goal: To identify and process feelings related to the disappointment of past painful events                    Improve ability to relax by utilizing coping skills as discussed        Identify and utilize emotional regulatory skills    Assessment of progress:  progressing     Anson [place], [facility]

## 2022-09-05 ENCOUNTER — Ambulatory Visit (INDEPENDENT_AMBULATORY_CARE_PROVIDER_SITE_OTHER): Admitting: Mental Health

## 2022-09-05 DIAGNOSIS — F411 Generalized anxiety disorder: Secondary | ICD-10-CM | POA: Diagnosis not present

## 2022-09-05 NOTE — Progress Notes (Signed)
[facility]  Name: Joshua Fry Date:   09/05/22 MRN: [id number] DOB: [birth year] PCP: Bary Leriche, PA-C  Time spent:  Treatment:  individual therapy   Mental Status Exam:    Appearance:    Casual     Behavior:   Appropriate  Motor:   WNL  Speech/Language:    Clear and Coherent  Affect:   Full range   Mood:   Euthymic, pleasant  Thought process:   Logical, linear, goal directed  Thought content:     WNL  Sensory/Perceptual disturbances:     none  Orientation:   x4  Attention:   Good  Concentration:   Good  Memory:   Intact  Fund of knowledge:    Consistent with age and development  Insight:     Good  Judgment:    Good  Impulse Control:   Good     Reported Symptoms:  sleep problems (night terrors), irritability, panic attacks, agitation  Risk Assessment: Danger to Self:  No Self-injurious Behavior: No Danger to Others: No Duty to Warn:no Physical Aggression / Violence:No  Access to Firearms a concern: No  Gang Involvement:No  Patient / guardian was educated about steps to take if suicide or homicide risk level increases between visits: yes While future psychiatric events cannot be accurately predicted, the patient does not currently require acute inpatient psychiatric care and does not currently meet [facility] involuntary commitment criteria.  Subjective: Patient arrived on time for today's session.  Assessed progress.  Patient shared his continued efforts to try and manage work-related stress.  Reviewed the balance he tries to strike between work and home.  He stated that he is trying to be mindful of this boundary, he tries to avoid checking his phone especially on weekends and other ways to try and manage this behavior was explored.  He plans to specifically not check email that is work-related.  He identified a sense of burnout at his job due to his position but also identifying his tendency to have difficulty with not  focusing on work related tasks even when he has off time.  Continue to explore collaboratively ways to maintain boundaries that have been effective and explored additional outlets.  Interventions:   motivational interviewing, CBT, mindfulness strategies   Diagnoses:    ICD-10-CM   1. Generalized anxiety disorder  F41.1            Plan: Patient is to use CBT, mindfulness and coping skills to help manage /decrease symptoms.  Patient to continue to work to recognize what he can control versus what he cannot in the way of trying to be supportive to those in his life and also meeting his own interpersonal needs to keep his stress and anxiety low.   Long-term goal:  Reduce overall level, frequency, and intensity of the feelings of depression, anxiety and panic so that daily functioning is not impaired.  Short-term goal: To identify and process feelings related to the disappointment of past painful events                    Improve ability to relax by utilizing coping skills as discussed        Identify and utilize emotional regulatory skills    Assessment of progress:  progressing     [name] Session, [facility]

## 2022-09-15 ENCOUNTER — Ambulatory Visit: Admitting: Behavioral Health

## 2022-09-19 ENCOUNTER — Ambulatory Visit: Admitting: Behavioral Health

## 2022-09-19 ENCOUNTER — Ambulatory Visit: Admitting: Mental Health

## 2022-09-19 ENCOUNTER — Encounter: Payer: Self-pay | Admitting: Behavioral Health

## 2022-09-19 DIAGNOSIS — F902 Attention-deficit hyperactivity disorder, combined type: Secondary | ICD-10-CM | POA: Diagnosis not present

## 2022-09-19 DIAGNOSIS — F411 Generalized anxiety disorder: Secondary | ICD-10-CM

## 2022-09-19 DIAGNOSIS — F4310 Post-traumatic stress disorder, unspecified: Secondary | ICD-10-CM

## 2022-09-19 DIAGNOSIS — F330 Major depressive disorder, recurrent, mild: Secondary | ICD-10-CM

## 2022-09-19 MED ORDER — AMPHETAMINE-DEXTROAMPHET ER 20 MG PO CP24: 20.0000 mg | ORAL_CAPSULE | Freq: Every day | ORAL | 0 refills | Status: DC

## 2022-09-19 MED ORDER — AMPHETAMINE-DEXTROAMPHETAMINE 20 MG PO TABS: 20.0000 mg | ORAL_TABLET | Freq: Every day | ORAL | 0 refills | Status: DC

## 2022-09-19 NOTE — Progress Notes (Signed)
[facility]  Name: Joshua Fry Date:   09/19/22 MRN: [id number] DOB: Dec 05, 1985 PCP: Bary Leriche, PA-C  Time spent: 49 minutes  Treatment:  individual therapy   Mental Status Exam:    Appearance:    Casual     Behavior:   Appropriate  Motor:   WNL  Speech/Language:    Clear and Coherent  Affect:   Full range   Mood:   Euthymic, pleasant  Thought process:   Logical, linear, goal directed  Thought content:     WNL  Sensory/Perceptual disturbances:     none  Orientation:   x4  Attention:   Good  Concentration:   Good  Memory:   Intact  Fund of knowledge:    Consistent with age and development  Insight:     Good  Judgment:    Good  Impulse Control:   Good     Reported Symptoms:  sleep problems (night terrors), irritability, panic attacks, agitation  Risk Assessment: Danger to Self:  No Self-injurious Behavior: No Danger to Others: No Duty to Warn:no Physical Aggression / Violence:No  Access to Firearms a concern: No  Gang Involvement:No  Patient / guardian was educated about steps to take if suicide or homicide risk level increases between visits: yes While future psychiatric events cannot be accurately predicted, the patient does not currently require acute inpatient psychiatric care and does not currently meet [facility] involuntary commitment criteria.  Subjective: Patient arrived on time for today's session.  Assessed progress where he shared recent issues at work.  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  Patient shared details, how this individual has had significant issues with their work performance although they think they are probably getting a high review based on what he has heard this individual speak to others about at the job sites.  He identified the need to be assertive, fair and, at this point, feels he is ready to have the review with his employer tomorrow.  He continues to navigate  a balance between his work life and personal life.  Continued to explore collaboratively ways to maintain boundaries that have been effective and outlets for stress management.  Interventions:   motivational interviewing, CBT, mindfulness strategies   Diagnoses:    ICD-10-CM   1. Attention deficit hyperactivity disorder (ADHD), combined type  F90.2     2. Generalized anxiety disorder  F41.1          Plan: Patient is to use CBT, mindfulness and coping skills to help manage /decrease symptoms.  Patient to continue to work to recognize what he can control versus what he cannot in the way of trying to be supportive to those in his life and also meeting his own interpersonal needs to keep his stress and anxiety low.   Long-term goal:  Reduce overall level, frequency, and intensity of the feelings of depression, anxiety and panic so that daily functioning is not impaired.  Short-term goal: To identify and process feelings related to the disappointment of past painful events                    Improve ability to relax by utilizing coping skills as discussed        Identify and utilize emotional regulatory skills    Assessment of progress:  progressing     [name] Session, [facility]

## 2022-09-19 NOTE — Progress Notes (Signed)
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  No changes this visit. He continues to say  that he has been doing well overall with anxiety and depression. Says he would like to try going back on extended release version of Adderall because he says the IR version just wears off to quick. He says his anxiety today is 2/10 and depression is 3/10. Requesting no medication changes today.    He denies mania, no auditory or visual hallucinations. Denies SI or HI. [facility].    Past psychiatric medication trials: Zoloft Lexapro Trintellix  Adderall- Nationwide shortage Vyvanse    Individual Medical History/ Review of Systems: Changes? :No   Allergies: Patient has no known allergies.  Current Medications:  Current Outpatient Medications:    amphetamine-dextroamphetamine (ADDERALL XR) 20 MG 24 hr capsule, Take 1 capsule (20 mg total) by mouth daily., Disp: 30 capsule, Rfl: 0   amphetamine-dextroamphetamine (ADDERALL) 20 MG tablet, Take 1 tablet (20 mg total) by mouth daily., Disp: 30 tablet, Rfl: 0   amLODipine (NORVASC) 5 MG tablet, Take 1 tablet by mouth daily., Disp: , Rfl:    amphetamine-dextroamphetamine (ADDERALL) 10 MG tablet, Take 1 tablet (10 mg total) by mouth 3 (three) times daily as needed. Take with 5 mg tab to equal 15 mg three times daily, Disp: 90 tablet, Rfl: 0   amphetamine-dextroamphetamine (ADDERALL) 15 MG tablet, Take 1 tablet by mouth 3 (three) times daily., Disp: 90 tablet, Rfl: 0   amphetamine-dextroamphetamine (ADDERALL) 5 MG tablet, Take 1 tablet (5 mg total) by mouth 3  (three) times daily as needed. Take with 10 mg tab to equal 15 mg three times daily, Disp: 90 tablet, Rfl: 0   azelastine (ASTELIN) 0.1 % nasal spray, U 1 SPR IEN BID, Disp: , Rfl:    busPIRone (BUSPAR) 15 MG tablet, Take 1 tablet (15 mg total) by mouth 2 (two) times daily., Disp: 60 tablet, Rfl: 3   chlorpheniramine-HYDROcodone (TUSSIONEX PENNKINETIC ER) 10-8 MG/5ML, Take 5 mLs by mouth at bedtime as needed for cough., Disp: 115 mL, Rfl: 0   fluticasone (FLONASE) 50 MCG/ACT nasal spray, SHAKE LQ AND U 1 TO 2 SPRAYS IEN QD, Disp: , Rfl:    omeprazole (PRILOSEC) 20 MG capsule, take 1 capsule by mouth once daily, Disp: 30 capsule, Rfl: 3   predniSONE (DELTASONE) 50 MG tablet, Take 1 tablet (50 mg total) by mouth daily., Disp: 3 tablet, Rfl: 0   vortioxetine HBr (TRINTELLIX) 20 MG TABS tablet, TAKE 1 TABLET(20 MG) BY MOUTH DAILY, Disp: 90 tablet, Rfl: 1 Medication Side Effects: none  Family Medical/ Social History: Changes? No  MENTAL HEALTH EXAM:  There were no vitals taken for this visit.There is no height or weight on file to calculate BMI.  General Appearance: Casual, Neat, and Well Groomed  Eye Contact:  Good  Speech:  Clear and Coherent  Volume:  Normal  Mood:  NA  Affect:  Appropriate  Thought Process:  Coherent  Orientation:  Full (Time, Place, and Person)  Thought Content: Logical   Suicidal Thoughts:  No  Homicidal Thoughts:  No  Memory:  WNL  Judgement:  Good  Insight:  Good  Psychomotor Activity:  Normal  Concentration:  Concentration: Good  Recall:  Good  Fund of Knowledge: Good  Language: Good  Assets:  Desire for Improvement  ADL's:  Intact  Cognition: WNL  Prognosis:  Good    DIAGNOSES:    ICD-10-CM   1. Attention deficit hyperactivity disorder (ADHD), combined type  F90.2 amphetamine-dextroamphetamine (ADDERALL XR) 20 MG 24 hr capsule    amphetamine-dextroamphetamine (ADDERALL) 20 MG tablet    2. Generalized anxiety disorder  F41.1     3. PTSD  (post-traumatic stress disorder)  F43.10     4. 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 Discussed his current level of stability.  We discussed his desire to go back on ER version of Adderall. Says the IR is wearing off too quick and he only changed because of nationwide shortage.  We agreed on the following: To continue Trintellix 20 mg daily To start Adderall 20 mg  IR in the afternoon daily as needed To start Adderall 20 mg ER in the am after breakfast Will report any side effects or worsening symptoms promptly Will follow up in 12 weeks to reassess Provided emergency contact information Discussed potential benefits, risks, and side effects of stimulants with patient to include increased heart rate, palpitations, insomnia, increased anxiety, increased irritability, or decreased appetite.  Instructed patient to contact office if experiencing any significant tolerability issues.  Reviewed PDMP     Joan Flores, NP

## 2022-10-24 ENCOUNTER — Other Ambulatory Visit: Payer: Self-pay | Admitting: Behavioral Health

## 2022-10-24 ENCOUNTER — Telehealth: Payer: Self-pay | Admitting: Behavioral Health

## 2022-10-24 DIAGNOSIS — F902 Attention-deficit hyperactivity disorder, combined type: Secondary | ICD-10-CM

## 2022-10-24 MED ORDER — AMPHETAMINE-DEXTROAMPHET ER 20 MG PO CP24: 20.0000 mg | ORAL_CAPSULE | Freq: Every day | ORAL | 0 refills | Status: DC

## 2022-10-24 MED ORDER — AMPHETAMINE-DEXTROAMPHETAMINE 20 MG PO TABS: 20.0000 mg | ORAL_TABLET | Freq: Every day | ORAL | 0 refills | Status: DC

## 2022-10-24 NOTE — Telephone Encounter (Signed)
Next appt is 12/19/22. Requesting refills on Adderall XR 20 mg and Adderall IR 20 mg called to:  [id number] [place], [place] - [address] AT [facility] [address]   Phone: [phone]  Fax: [phone]    Per [facility], they are both in stock.

## 2022-10-25 ENCOUNTER — Ambulatory Visit (INDEPENDENT_AMBULATORY_CARE_PROVIDER_SITE_OTHER): Admitting: Mental Health

## 2022-10-25 DIAGNOSIS — F411 Generalized anxiety disorder: Secondary | ICD-10-CM

## 2022-10-25 DIAGNOSIS — F902 Attention-deficit hyperactivity disorder, combined type: Secondary | ICD-10-CM

## 2022-10-25 NOTE — Progress Notes (Signed)
[facility]  Name: Joshua Fry Date:   10/25/22 MRN: [id number] DOB: 26-Apr-1986 PCP: Joshua Leriche, PA-C  Time spent:  48 minutes  Treatment:  individual therapy   Mental Status Exam:    Appearance:    Casual     Behavior:   Appropriate  Motor:   WNL  Speech/Language:    Clear and Coherent  Affect:   Full range   Mood:   Euthymic, pleasant  Thought process:   Logical, linear, goal directed  Thought content:     WNL  Sensory/Perceptual disturbances:     none  Orientation:   x4  Attention:   Good  Concentration:   Good  Memory:   Intact  Fund of knowledge:    Consistent with age and development  Insight:     Good  Judgment:    Good  Impulse Control:   Good     Reported Symptoms:  sleep problems (night terrors), irritability, panic attacks, agitation  Risk Assessment: Danger to Self:  No Self-injurious Behavior: No Danger to Others: No Duty to Warn:no Physical Aggression / Violence:No  Access to Firearms a concern: No  Gang Involvement:No  Patient / guardian was educated about steps to take if suicide or homicide risk level increases between visits: yes While future psychiatric events cannot be accurately predicted, the patient does not currently require acute inpatient psychiatric care and does not currently meet [facility] involuntary commitment criteria.  Subjective: Patient arrived on time for today's session.   Patient shared progress, how he has handled stress at work specifically.  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  He stated this employee eventually quit 2 days later, this being somewhat relieving as patient does not like to be in a position where he has to terminate anyone's employment.  Ways he felt comfortable in making this decision to intervene and review the progress this employee was struggling with was shared.  He stated that as a result, his job has become more intensive,  being on the job sites more frequently and taking away from some of his other job responsibilities.  Explored self-care, taking time off where he stated that he has not taken a week off consecutively in many years.  He plans to take a week off in July around his wife's birthday and their anniversary.  He identified in session reasons needing to take this time to destress, help manage his anxiety associated with his demanding work schedule.   Interventions:   motivational interviewing, CBT, mindfulness strategies   Diagnoses:    ICD-10-CM   1. Generalized anxiety disorder  F41.1     2. Attention deficit hyperactivity disorder (ADHD), combined type  F90.2           Plan: Patient is to use CBT, mindfulness and coping skills to help manage /decrease symptoms.  Patient to continue to work to recognize what he can control versus what he cannot in the way of trying to be supportive to those in his life and also meeting his own interpersonal needs to keep his stress and anxiety low.   Long-term goal:  Reduce overall level, frequency, and intensity of the feelings of depression, anxiety and panic so that daily functioning is not impaired.  Short-term goal: To identify and process feelings related to the disappointment of past painful events  Improve ability to relax by utilizing coping skills as discussed        Identify and utilize emotional regulatory skills    Assessment of progress:  progressing     [name] Session, [facility]

## 2022-11-23 ENCOUNTER — Other Ambulatory Visit: Payer: Self-pay

## 2022-11-23 ENCOUNTER — Telehealth: Payer: Self-pay | Admitting: Behavioral Health

## 2022-11-23 DIAGNOSIS — F902 Attention-deficit hyperactivity disorder, combined type: Secondary | ICD-10-CM

## 2022-11-23 MED ORDER — AMPHETAMINE-DEXTROAMPHET ER 20 MG PO CP24: 20.0000 mg | ORAL_CAPSULE | Freq: Every day | ORAL | 0 refills | Status: DC

## 2022-11-23 MED ORDER — AMPHETAMINE-DEXTROAMPHETAMINE 20 MG PO TABS
20.0000 mg | ORAL_TABLET | Freq: Every day | ORAL | 0 refills | Status: DC
Start: 2022-11-23 — End: 2022-12-28

## 2022-11-23 NOTE — Telephone Encounter (Signed)
Next visit is 12/19/22. Per pharmacist at [facility] they need new prescriptions for [facility] faxed to them. They are:  Adderall XR 20 mg and Adderall IR 15 mg.   Pharmacy is:  [facility] #[id number] [place], [place] - [address] AT [facility] OF [address] [facility]   Phone: [phone]  Fax: [phone]

## 2022-11-23 NOTE — Telephone Encounter (Signed)
Verified with patient that dose was 20 XR and 20 IR.

## 2022-11-23 NOTE — Telephone Encounter (Signed)
Pended.

## 2022-12-07 ENCOUNTER — Ambulatory Visit (INDEPENDENT_AMBULATORY_CARE_PROVIDER_SITE_OTHER): Admitting: Mental Health

## 2022-12-07 DIAGNOSIS — F411 Generalized anxiety disorder: Secondary | ICD-10-CM

## 2022-12-07 NOTE — Progress Notes (Signed)
[facility] note  Name: Joshua Fry Date:   10/25/22 MRN: [id number] DOB: March 06, 1986 PCP: Bary Leriche, PA-C  Time in: 2: 00 p.m. time out: 2 5 2  PM  Treatment:  individual therapy   Mental Status Exam:    Appearance:    Casual     Behavior:   Appropriate  Motor:   WNL  Speech/Language:    Clear and Coherent  Affect:   Full range   Mood:   Euthymic, pleasant  Thought process:   Logical, linear, goal directed  Thought content:     WNL  Sensory/Perceptual disturbances:     none  Orientation:   x4  Attention:   Good  Concentration:   Good  Memory:   Intact  Fund of knowledge:    Consistent with age and development  Insight:     Good  Judgment:    Good  Impulse Control:   Good     Reported Symptoms:  sleep problems (night terrors), irritability, panic attacks, agitation  Risk Assessment: Danger to Self:  No Self-injurious Behavior: No Danger to Others: No Duty to Warn:no Physical Aggression / Violence:No  Access to Firearms a concern: No  Gang Involvement:No  Patient / guardian was educated about steps to take if suicide or homicide risk level increases between visits: yes While future psychiatric events cannot be accurately predicted, the patient does not currently require acute inpatient psychiatric care and does not currently meet [facility] involuntary commitment criteria.  Subjective: Patient arrived on time for today's session.  Assessed progress since last visit where he shared work experiences.  He stated that he has been able to reliant on some of the staff more and this has helped ease some of his stress at work.  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  Explored his continuing to set some interpersonal boundaries between work and home to allow himself to destress and relax.  He stated that he has tried to be cognizant of this, limiting how much he checks email.  He  stated that he and his wife went to his brothers wedding a few weeks ago and he stopped checking email a day or 2 into the trip as a way to set a boundary.  He stated he and his wife plan to take a trip together next week and he plans to continue to set boundaries to allow for him to enjoy a vacation and not focus on work.  He looks forward to this trip as is the 5-year wedding anniversary with his wife.    Interventions:   motivational interviewing, CBT, mindfulness strategies   Diagnoses:    ICD-10-CM   1. Generalized anxiety disorder  F41.1         Plan: Patient is to use CBT, mindfulness and coping skills to help manage /decrease symptoms.  Patient to continue to work to recognize what he can control versus what he cannot in the way of trying to be supportive to those in his life and also meeting his own interpersonal needs to keep his stress and anxiety low.   Long-term goal:  Reduce overall level, frequency, and intensity of the feelings of depression, anxiety and panic so that daily functioning is not impaired.  Short-term goal: To identify and process feelings related to the disappointment of past painful events  Improve ability to relax by utilizing coping skills as discussed        Identify and utilize emotional regulatory skills    Assessment of progress:  progressing     [name] Session, [facility]

## 2022-12-19 ENCOUNTER — Ambulatory Visit: Admitting: Behavioral Health

## 2022-12-21 ENCOUNTER — Ambulatory Visit: Admitting: Mental Health

## 2022-12-28 ENCOUNTER — Encounter: Payer: Self-pay | Admitting: Behavioral Health

## 2022-12-28 ENCOUNTER — Ambulatory Visit (INDEPENDENT_AMBULATORY_CARE_PROVIDER_SITE_OTHER): Admitting: Behavioral Health

## 2022-12-28 DIAGNOSIS — F331 Major depressive disorder, recurrent, moderate: Secondary | ICD-10-CM

## 2022-12-28 DIAGNOSIS — F411 Generalized anxiety disorder: Secondary | ICD-10-CM

## 2022-12-28 DIAGNOSIS — F902 Attention-deficit hyperactivity disorder, combined type: Secondary | ICD-10-CM

## 2022-12-28 DIAGNOSIS — F4310 Post-traumatic stress disorder, unspecified: Secondary | ICD-10-CM | POA: Diagnosis not present

## 2022-12-28 MED ORDER — AMPHETAMINE-DEXTROAMPHETAMINE 20 MG PO TABS
20.0000 mg | ORAL_TABLET | Freq: Every day | ORAL | 0 refills | Status: DC
Start: 2022-12-28 — End: 2023-01-30

## 2022-12-28 MED ORDER — BUSPIRONE HCL 15 MG PO TABS
15.0000 mg | ORAL_TABLET | Freq: Two times a day (BID) | ORAL | 1 refills | Status: DC
Start: 2022-12-28 — End: 2023-07-02

## 2022-12-28 MED ORDER — VORTIOXETINE HBR 20 MG PO TABS: ORAL_TABLET | ORAL | 1 refills | Status: DC

## 2022-12-28 MED ORDER — AMPHETAMINE-DEXTROAMPHET ER 20 MG PO CP24: 20.0000 mg | ORAL_CAPSULE | Freq: Every day | ORAL | 0 refills | Status: DC

## 2022-12-28 NOTE — Progress Notes (Signed)
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  No changes this visit. He continues to say  that he has been doing well overall with anxiety and depression. Says he would like to try going back on extended release version of Adderall because he says the IR version just wears off to quick. He says his anxiety today is 2/10 and depression is 3/10. Requesting no medication changes today.    He denies mania, no auditory or visual hallucinations. Denies SI or HI. [facility].    Past psychiatric medication trials: Zoloft Lexapro Trintellix  Adderall- Nationwide shortage Vyvanse Individual Medical History/ Review of Systems: Changes? :No   Allergies: Patient has no known allergies.  Current Medications:  Current Outpatient Medications:    amLODipine (NORVASC) 5 MG tablet, Take 1 tablet by mouth daily., Disp: , Rfl:    amphetamine-dextroamphetamine (ADDERALL XR) 20 MG 24 hr capsule, Take 1 capsule (20 mg total) by mouth daily., Disp: 30 capsule, Rfl: 0   amphetamine-dextroamphetamine (ADDERALL) 20 MG tablet, Take 1 tablet (20 mg total) by mouth daily., Disp: 30 tablet, Rfl: 0   azelastine (ASTELIN) 0.1 % nasal spray, U 1 SPR IEN BID, Disp: , Rfl:    busPIRone (BUSPAR) 15 MG tablet, Take 1 tablet (15 mg total) by mouth 2 (two) times daily., Disp: 180 tablet, Rfl: 1   chlorpheniramine-HYDROcodone (TUSSIONEX PENNKINETIC ER) 10-8 MG/5ML, Take 5 mLs by mouth at bedtime as needed for cough., Disp: 115 mL, Rfl: 0   fluticasone (FLONASE) 50 MCG/ACT nasal spray, SHAKE LQ AND U 1 TO 2 SPRAYS IEN QD, Disp: , Rfl:    omeprazole  (PRILOSEC) 20 MG capsule, take 1 capsule by mouth once daily, Disp: 30 capsule, Rfl: 3   predniSONE (DELTASONE) 50 MG tablet, Take 1 tablet (50 mg total) by mouth daily., Disp: 3 tablet, Rfl: 0   vortioxetine HBr (TRINTELLIX) 20 MG TABS tablet, TAKE 1 TABLET(20 MG) BY MOUTH DAILY, Disp: 90 tablet, Rfl: 1 Medication Side Effects: none  Family Medical/ Social History: Changes? No  MENTAL HEALTH EXAM:  There were no vitals taken for this visit.There is no height or weight on file to calculate BMI.  General Appearance: Casual, Neat, and Well Groomed  Eye Contact:  Good  Speech:  Clear and Coherent  Volume:  Normal  Mood:  NA  Affect:  Appropriate  Thought Process:  Coherent  Orientation:  Full (Time, Place, and Person)  Thought Content: Logical   Suicidal Thoughts:  No  Homicidal Thoughts:  No  Memory:  WNL  Judgement:  Good  Insight:  Good  Psychomotor Activity:  Normal  Concentration:  Concentration: Good  Recall:  Good  Fund of Knowledge: Good  Language: Good  Assets:  Desire for Improvement  ADL's:  Impaired  Cognition: WNL  Prognosis:  Good    DIAGNOSES:    ICD-10-CM   1. Attention deficit hyperactivity disorder (ADHD), combined type  F90.2 amphetamine-dextroamphetamine (ADDERALL XR) 20 MG 24 hr capsule    amphetamine-dextroamphetamine (ADDERALL) 20 MG tablet    2. Major depressive disorder, recurrent episode, moderate (HCC)  F33.1 vortioxetine HBr (TRINTELLIX) 20 MG TABS tablet  busPIRone (BUSPAR) 15 MG tablet    3. Generalized anxiety disorder  F41.1 vortioxetine HBr (TRINTELLIX) 20 MG TABS tablet    busPIRone (BUSPAR) 15 MG tablet    4. PTSD (post-traumatic stress disorder)  F43.10 busPIRone (BUSPAR) 15 MG tablet      Receiving Psychotherapy: No    RECOMMENDATIONS:   Greater than 50% of 30 min face to face time with patient was spent on counseling and coordination of care. Discussed his current level of stability. He is doing very well right now. Request  no medication changes this visit. Provided refills.  To continue Trintellix 20 mg daily To continue Adderall 20 mg  IR in the afternoon daily as needed To start Adderall 20 mg ER in the am after breakfast Will report any side effects or worsening symptoms promptly Will follow up in 6 months to reassess Provided emergency contact information Discussed potential benefits, risks, and side effects of stimulants with patient to include increased heart rate, palpitations, insomnia, increased anxiety, increased irritability, or decreased appetite.  Instructed patient to contact office if experiencing any significant tolerability issues.  Reviewed PDMP    Joan Flores, NP

## 2023-01-03 ENCOUNTER — Ambulatory Visit: Admitting: Mental Health

## 2023-01-18 ENCOUNTER — Ambulatory Visit: Admitting: Mental Health

## 2023-01-30 ENCOUNTER — Other Ambulatory Visit: Payer: Self-pay

## 2023-01-30 ENCOUNTER — Telehealth: Payer: Self-pay | Admitting: Behavioral Health

## 2023-01-30 DIAGNOSIS — F902 Attention-deficit hyperactivity disorder, combined type: Secondary | ICD-10-CM

## 2023-01-30 NOTE — Telephone Encounter (Signed)
Refill can go to [facility]  at [address] in [place], [name].

## 2023-01-30 NOTE — Telephone Encounter (Signed)
 LVM to [facility]. Which pharmacy?

## 2023-01-30 NOTE — Telephone Encounter (Signed)
Called patient again - pharmacy is [facility] in [place].

## 2023-01-30 NOTE — Telephone Encounter (Signed)
Pended.

## 2023-01-30 NOTE — Telephone Encounter (Signed)
Joshua Fry requesting refills on both of his Adderall's. Last seen 12/28/22. A follow is scheduled for next year.  Contact # [phone]

## 2023-01-31 MED ORDER — AMPHETAMINE-DEXTROAMPHET ER 20 MG PO CP24
20.0000 mg | ORAL_CAPSULE | Freq: Every day | ORAL | 0 refills | Status: DC
Start: 2023-01-31 — End: 2023-03-06

## 2023-01-31 MED ORDER — AMPHETAMINE-DEXTROAMPHETAMINE 20 MG PO TABS
20.0000 mg | ORAL_TABLET | Freq: Every day | ORAL | 0 refills | Status: DC
Start: 2023-01-31 — End: 2023-03-06

## 2023-02-01 ENCOUNTER — Ambulatory Visit: Admitting: Mental Health

## 2023-02-01 DIAGNOSIS — F331 Major depressive disorder, recurrent, moderate: Secondary | ICD-10-CM

## 2023-02-01 DIAGNOSIS — F902 Attention-deficit hyperactivity disorder, combined type: Secondary | ICD-10-CM | POA: Diagnosis not present

## 2023-02-01 NOTE — Progress Notes (Signed)
[facility]  Name: Joshua Fry Date:   02/01/23 MRN: [id number] DOB: 10-Feb-1986 PCP: Bary Leriche, PA-C  Time spent: 50 minutes  Time in: 1: 00pm time out: 1:50pm   Treatment:  individual therapy   Mental Status Exam:    Appearance:    Casual     Behavior:   Appropriate  Motor:   WNL  Speech/Language:    Clear and Coherent  Affect:   Full range   Mood:   Euthymic, pleasant  Thought process:   Logical, linear, goal directed  Thought content:     WNL  Sensory/Perceptual disturbances:     none  Orientation:   x4  Attention:   Good  Concentration:   Good  Memory:   Intact  Fund of knowledge:    Consistent with age and development  Insight:     Good  Judgment:    Good  Impulse Control:   Good     Reported Symptoms:  sleep problems (night terrors), irritability, panic attacks, agitation  Risk Assessment: Danger to Self:  No Self-injurious Behavior: No Danger to Others: No Duty to Warn:no Physical Aggression / Violence:No  Access to Firearms a concern: No  Gang Involvement:No  Patient / guardian was educated about steps to take if suicide or homicide risk level increases between visits: yes While future psychiatric events cannot be accurately predicted, the patient does not currently require acute inpatient psychiatric care and does not currently meet [facility] involuntary commitment criteria.  Subjective: Patient arrived on time for today's session.  Patient shared how he is continue to work on trying to relax and destress trying to balance his work and personal life.  Explored efforts in recent events related to work, his trying to make a more clear separation after getting home from work to "leave work at work ".  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  Patient shared how  he is already doing this to a large extent due to his significant work history and knowledge he brings to his position.  Patient maintains there continues to be a considerable amount of stress at work due to his having many responsibilities and the nature of his role.  Explored collaboratively ways to continue to relax when at home, he enjoys playing video games, spending time with friends often online and making time for he and his wife given their busy schedules.   Interventions:   motivational interviewing, CBT, mindfulness strategies   Diagnoses:    ICD-10-CM   1. Attention deficit hyperactivity disorder (ADHD), combined type  F90.2     2. Major depressive disorder, recurrent episode, moderate (HCC)  F33.1          Plan: Patient is to use CBT, mindfulness and coping skills to help manage /decrease symptoms.  Patient to continue to work to recognize what he can control versus what he cannot in the way of trying to be supportive to those in his life and also meeting his own interpersonal needs to keep his stress and anxiety low.   Long-term goal:  Reduce overall level, frequency, and intensity of the feelings of depression, anxiety and panic so that daily functioning is not impaired.  Short-term goal: To identify and process feelings related to the disappointment of past painful events  Improve ability to relax by utilizing coping skills as discussed        Identify and utilize emotional regulatory skills    Assessment of progress:  progressing     [name] Session, [facility]

## 2023-02-15 ENCOUNTER — Ambulatory Visit: Admitting: Mental Health

## 2023-03-01 ENCOUNTER — Ambulatory Visit: Admitting: Mental Health

## 2023-03-06 ENCOUNTER — Telehealth: Payer: Self-pay | Admitting: Behavioral Health

## 2023-03-06 ENCOUNTER — Other Ambulatory Visit: Payer: Self-pay

## 2023-03-06 DIAGNOSIS — F902 Attention-deficit hyperactivity disorder, combined type: Secondary | ICD-10-CM

## 2023-03-06 MED ORDER — AMPHETAMINE-DEXTROAMPHETAMINE 20 MG PO TABS: 20.0000 mg | ORAL_TABLET | Freq: Every day | ORAL | 0 refills | Status: DC

## 2023-03-06 MED ORDER — AMPHETAMINE-DEXTROAMPHET ER 20 MG PO CP24
20.0000 mg | ORAL_CAPSULE | Freq: Every day | ORAL | 0 refills | Status: DC
Start: 2023-03-06 — End: 2023-04-12

## 2023-03-06 NOTE — Telephone Encounter (Signed)
Pended.

## 2023-03-06 NOTE — Telephone Encounter (Signed)
Pt called at 1:00pm. Please refill both doses of Adderall. Send in to [facility]: [facility] #[id number] - [place], [place] - [address] AT [facility] [address]

## 2023-04-12 ENCOUNTER — Other Ambulatory Visit: Payer: Self-pay

## 2023-04-12 ENCOUNTER — Telehealth: Payer: Self-pay | Admitting: Behavioral Health

## 2023-04-12 DIAGNOSIS — F902 Attention-deficit hyperactivity disorder, combined type: Secondary | ICD-10-CM

## 2023-04-12 NOTE — Telephone Encounter (Signed)
Called patient to see if he wanted both doses filled and he does. Pended.

## 2023-04-12 NOTE — Telephone Encounter (Signed)
Patient lvm requesting a refill on the Adderall. Fill at [facility] #[id number] - [place], [name] - [address] AT [facility] [address] [facility] [address] [place], [name] [address] Phone: [phone]  Fax: [phone]   Appointment 07/02/23

## 2023-04-13 MED ORDER — AMPHETAMINE-DEXTROAMPHET ER 20 MG PO CP24
20.0000 mg | ORAL_CAPSULE | Freq: Every day | ORAL | 0 refills | Status: DC
Start: 2023-04-13 — End: 2023-08-03

## 2023-04-13 MED ORDER — AMPHETAMINE-DEXTROAMPHETAMINE 20 MG PO TABS
20.0000 mg | ORAL_TABLET | Freq: Every day | ORAL | 0 refills | Status: DC
Start: 2023-04-13 — End: 2023-08-03

## 2023-07-02 ENCOUNTER — Encounter: Payer: Self-pay | Admitting: Behavioral Health

## 2023-07-02 ENCOUNTER — Ambulatory Visit: Admitting: Behavioral Health

## 2023-07-02 DIAGNOSIS — F331 Major depressive disorder, recurrent, moderate: Secondary | ICD-10-CM

## 2023-07-02 DIAGNOSIS — F4310 Post-traumatic stress disorder, unspecified: Secondary | ICD-10-CM

## 2023-07-02 DIAGNOSIS — F411 Generalized anxiety disorder: Secondary | ICD-10-CM | POA: Diagnosis not present

## 2023-07-02 MED ORDER — VORTIOXETINE HBR 20 MG PO TABS
ORAL_TABLET | ORAL | 1 refills | Status: DC
Start: 2023-07-02 — End: 2023-08-07

## 2023-07-02 MED ORDER — BUSPIRONE HCL 15 MG PO TABS
15.0000 mg | ORAL_TABLET | Freq: Two times a day (BID) | ORAL | 1 refills | Status: DC
Start: 2023-07-02 — End: 2023-12-31

## 2023-07-02 NOTE — Progress Notes (Signed)
[facility]  Patient ID: Joshua Fry,  MRN: [id number]  PCP: Bary Leriche, PA-C  Date of Evaluation: 07/02/2023 Time spent:30 minutes  Chief Complaint:  Chief Complaint   Anxiety; Depression; Follow-up; Patient Education; Medication Refill; ADHD     HISTORY/CURRENT STATUS: HPI  38 year old male combat veteran is seen today for follow up and medication management.  Says that he stopped his ADHD medication on Christmas eve. Says he does not want to continue with it for now. Says that so far he has been managing but he wants to see if he can function without it.  He continues to say  that he has been doing well overall with anxiety and depression. He says his anxiety today is 2/10 and depression is 2/10. Requesting no medication changes today.    He denies mania, no auditory or visual hallucinations. Denies SI or HI. [facility].    Past psychiatric medication trials: Zoloft Lexapro Trintellix  Adderall- [employer] shortage Vyvanse    Individual Medical History/ Review of Systems: Changes? :No   Allergies: Patient has no known allergies.  Current Medications:  Current Outpatient Medications:    amLODipine (NORVASC) 5 MG tablet, Take 1 tablet by mouth daily., Disp: , Rfl:    amphetamine-dextroamphetamine (ADDERALL XR) 20 MG 24 hr capsule, Take 1 capsule (20 mg total) by mouth daily., Disp: 30 capsule, Rfl: 0   amphetamine-dextroamphetamine (ADDERALL) 20 MG tablet, Take 1 tablet (20 mg total) by mouth daily., Disp: 30 tablet, Rfl: 0   azelastine (ASTELIN) 0.1 % nasal spray, U 1 SPR IEN BID, Disp: , Rfl:    busPIRone (BUSPAR) 15 MG tablet, Take 1 tablet (15 mg total) by mouth 2 (two) times daily., Disp: 180 tablet, Rfl: 1   chlorpheniramine-HYDROcodone (TUSSIONEX PENNKINETIC ER) 10-8 MG/5ML, Take 5 mLs by mouth at bedtime as needed for cough., Disp: 115 mL, Rfl: 0   fluticasone (FLONASE) 50 MCG/ACT nasal spray, SHAKE LQ AND U 1 TO 2  SPRAYS IEN QD, Disp: , Rfl:    omeprazole (PRILOSEC) 20 MG capsule, take 1 capsule by mouth once daily, Disp: 30 capsule, Rfl: 3   predniSONE (DELTASONE) 50 MG tablet, Take 1 tablet (50 mg total) by mouth daily., Disp: 3 tablet, Rfl: 0   vortioxetine HBr (TRINTELLIX) 20 MG TABS tablet, TAKE 1 TABLET(20 MG) BY MOUTH DAILY, Disp: 90 tablet, Rfl: 1 Medication Side Effects: none  Family Medical/ Social History: Changes? No  MENTAL HEALTH EXAM:  There were no vitals taken for this visit.There is no height or weight on file to calculate BMI.  General Appearance: Casual, Neat, and Well Groomed  Eye Contact:  Good  Speech:  Clear and Coherent  Volume:  Normal  Mood:  NA  Affect:  Appropriate  Thought Process:  Coherent  Orientation:  Full (Time, Place, and Person)  Thought Content: Logical   Suicidal Thoughts:  No  Homicidal Thoughts:  No  Memory:  WNL  Judgement:  Good  Insight:  Good  Psychomotor Activity:  Normal  Concentration:  Concentration: Good  Recall:  Good  Fund of Knowledge: Good  Language: Good  Assets:  Desire for Improvement  ADL's:  Intact  Cognition: WNL  Prognosis:  Good    DIAGNOSES:    ICD-10-CM   1. Major depressive disorder, recurrent episode, moderate (HCC)  F33.1 vortioxetine HBr (TRINTELLIX) 20 MG TABS tablet    busPIRone (BUSPAR) 15 MG tablet    2. Generalized anxiety disorder  F41.1 vortioxetine HBr (TRINTELLIX) 20 MG TABS tablet    busPIRone (BUSPAR) 15 MG tablet    3. PTSD (post-traumatic stress disorder)  F43.10 busPIRone (BUSPAR) 15 MG tablet      Receiving Psychotherapy: No    RECOMMENDATIONS:   Greater than 50% of 30 min face to face time with patient was spent on counseling and coordination of care. Discussed his current level of stability. He is doing very well right now.  Discussed his desire to continue without Adderall. Request no medication changes this visit. Provided refills.  To continue Trintellix 20 mg daily Will report any  side effects or worsening symptoms promptly Will follow up in 6 months to reassess Provided emergency contact information Discussed potential benefits, risks, and side effects of stimulants with patient to include increased heart rate, palpitations, insomnia, increased anxiety, increased irritability, or decreased appetite.  Instructed patient to contact office if experiencing any significant tolerability issues.  Reviewed PDMP   Joan Flores, NP

## 2023-08-02 ENCOUNTER — Telehealth: Payer: Self-pay | Admitting: Behavioral Health

## 2023-08-02 NOTE — Telephone Encounter (Signed)
 Eagan called and LM at 3:00 stating that several months ago he had decided to stop the Adderall.  At the last visit you indicated that if he changed his mind to let you know.  Well, he has changed his mind and would like a refill of the Adderall.  Next appt is 8/4. Can you fill the prescription or does he need to come in for another appt.?  Please call to advise.

## 2023-08-03 ENCOUNTER — Other Ambulatory Visit: Payer: Self-pay

## 2023-08-03 DIAGNOSIS — F902 Attention-deficit hyperactivity disorder, combined type: Secondary | ICD-10-CM

## 2023-08-03 MED ORDER — AMPHETAMINE-DEXTROAMPHETAMINE 20 MG PO TABS
20.0000 mg | ORAL_TABLET | Freq: Every day | ORAL | 0 refills | Status: DC
Start: 2023-08-03 — End: 2023-09-06

## 2023-08-03 MED ORDER — AMPHETAMINE-DEXTROAMPHET ER 20 MG PO CP24
20.0000 mg | ORAL_CAPSULE | Freq: Every day | ORAL | 0 refills | Status: DC
Start: 2023-08-03 — End: 2023-09-06

## 2023-08-03 NOTE — Telephone Encounter (Signed)
 Pended Adderall 20 XR and 20 IR to [facility].

## 2023-08-06 ENCOUNTER — Ambulatory Visit: Admitting: Mental Health

## 2023-08-06 DIAGNOSIS — F411 Generalized anxiety disorder: Secondary | ICD-10-CM

## 2023-08-06 NOTE — Progress Notes (Signed)
 [facility]  Name: Joshua Fry Date:   08/06/23 MRN: [id number] DOB: 04-21-1986 PCP: Bary Leriche, PA-C  Time spent: 51 minutes  Time in: 1: 00pm time out: 1:51pm   Treatment:  individual therapy   Mental Status Exam:    Appearance:    Casual     Behavior:   Appropriate  Motor:   WNL  Speech/Language:    Clear and Coherent  Affect:   Full range   Mood:   Euthymic, pleasant  Thought process:   Logical, linear, goal directed  Thought content:     WNL  Sensory/Perceptual disturbances:     none  Orientation:   x4  Attention:   Good  Concentration:   Good  Memory:   Intact  Fund of knowledge:    Consistent with age and development  Insight:     Good  Judgment:    Good  Impulse Control:   Good     Reported Symptoms:  sleep problems, irritability, panic attacks, agitation  Risk Assessment: Danger to Self:  No Self-injurious Behavior: No Danger to Others: No Duty to Warn:no Physical Aggression / Violence:No  Access to Firearms a concern: No  Gang Involvement:No  Patient / guardian was educated about steps to take if suicide or homicide risk level increases between visits: yes While future psychiatric events cannot be accurately predicted, the patient does not currently require acute inpatient psychiatric care and does not currently meet [facility] involuntary commitment criteria.  Subjective: Patient arrived on time for today's session.  Assessed progress since last visit which was approximately 6 months ago.  Patient shared how he continues to have stress related to work.  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  He stated that it is difficult for him, he has a tendency to take care of  many tasks on his own as he wants there to be no issues as the projects move forward.  He plans to take the time and recognize instances where he can delegate a task more fully.  The relationship with his wife continues to go well, she remains highly supportive.  He plans to try and engage in more pleasurable activities, plans to start working out again in the mornings, play golf when possible.    Interventions:   motivational interviewing, CBT, mindfulness strategies   Diagnoses:    ICD-10-CM   1. Generalized anxiety disorder  F41.1        Plan: Patient is to use CBT, mindfulness and coping skills to help manage /decrease symptoms.      Long-term goal:  Reduce overall level, frequency, and intensity of the feelings of depression, anxiety and panic so that daily functioning is not impaired.  Short-term goal: To identify and process feelings related to the disappointment of past painful events                    Improve ability to relax by utilizing coping skills as discussed        Identify and utilize emotional regulatory skills    Assessment of progress:  progressing     [name] Session, [facility]

## 2023-08-07 ENCOUNTER — Telehealth: Payer: Self-pay | Admitting: Behavioral Health

## 2023-08-07 DIAGNOSIS — F411 Generalized anxiety disorder: Secondary | ICD-10-CM

## 2023-08-07 DIAGNOSIS — F331 Major depressive disorder, recurrent, moderate: Secondary | ICD-10-CM

## 2023-08-07 MED ORDER — VORTIOXETINE HBR 20 MG PO TABS: ORAL_TABLET | ORAL | 0 refills | Status: DC

## 2023-08-07 NOTE — Telephone Encounter (Signed)
 Rx for Trintellix #2 sent to the requested pharmacy

## 2023-08-07 NOTE — Telephone Encounter (Signed)
 Next appt is 12/31/22. Joshua Fry is in [name],  [name] for work and didn't bring his Trintellux with him. He is requesting if we can prescribe two pills while he is working in this location until he gets home. Please call in to:  [facility], [address] [place], [name] [address]. Phone # [phone].

## 2023-08-24 ENCOUNTER — Other Ambulatory Visit: Payer: Self-pay

## 2023-08-24 ENCOUNTER — Ambulatory Visit
Admission: EM | Admit: 2023-08-24 | Discharge: 2023-08-24 | Disposition: A | Discharge status: Home / Self Care | Attending: Emergency Medicine | Admitting: Emergency Medicine

## 2023-08-24 ENCOUNTER — Encounter: Payer: Self-pay | Admitting: Emergency Medicine

## 2023-08-24 DIAGNOSIS — L0501 Pilonidal cyst with abscess: Secondary | ICD-10-CM

## 2023-08-24 MED ORDER — DOXYCYCLINE HYCLATE 100 MG PO CAPS: 100.0000 mg | ORAL_CAPSULE | Freq: Two times a day (BID) | ORAL | 0 refills | Status: AC

## 2023-08-24 NOTE — ED Triage Notes (Signed)
 Patient presents to [facility] for evaluation of lower mid back pain that started approx 1 week ago with skin sensitivitiy.  I has since grown and become more painful.  Last night he was cleaning it off to apply "butt paste" and it began to ooze discharge (very malodorous per patient) and blood.  Felt chills over the week, but denies a fever

## 2023-08-24 NOTE — ED Provider Notes (Signed)
 Joshua Fry    CSN: [id number] Arrival date & time: 08/24/23  1446      History   Chief Complaint Chief Complaint  Patient presents with   Abscess    HPI Joshua Fry is a 38 y.o. male.   Patient presents for evaluation of a rash present to the buttocks for 7 days.  Has become increasingly painful.  Has been applying zinc oxide paste which has become painful during removal due to help states it is.  Endorses yesterday attempting to clean saw blood and pus.  Has had chills overnight but denies fever.  First occurrence.  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 Authorizing Provider  doxycycline (VIBRAMYCIN) 100 MG capsule Take 1 capsule (100 mg total) by mouth 2 (two) times daily. 08/24/23  Yes Gerturde Kuba R, NP  amLODipine (NORVASC) 5 MG tablet Take 1 tablet by mouth daily. 06/20/21   [provider]  amphetamine-dextroamphetamine (ADDERALL XR) 20 MG 24 hr capsule Take 1 capsule (20 mg total) by mouth daily. 08/03/23 09/02/23  Joan Flores, NP  amphetamine-dextroamphetamine (ADDERALL) 20 MG tablet Take 1 tablet (20 mg total) by mouth daily. 08/03/23 09/02/23  Joan Flores, NP   azelastine (ASTELIN) 0.1 % nasal spray U 1 SPR IEN BID 10/29/18   [provider]  busPIRone (BUSPAR) 15 MG tablet Take 1 tablet (15 mg total) by mouth 2 (two) times daily. 07/02/23   Joan Flores, NP  chlorpheniramine-HYDROcodone (TUSSIONEX PENNKINETIC ER) 10-8 MG/5ML Take 5 mLs by mouth at bedtime as needed for cough. 08/16/21   Isa Rankin, MD  fluticasone (FLONASE) 50 MCG/ACT nasal spray SHAKE LQ AND U 1 TO 2 SPRAYS IEN QD 10/29/18   [provider]  omeprazole (PRILOSEC) 20 MG capsule take 1 capsule by mouth once daily 10/11/16   Waldon Merl, PA-C  predniSONE (DELTASONE) 50 MG tablet Take 1 tablet (50 mg total) by mouth daily. 08/16/21   Isa Rankin, MD  vortioxetine HBr (TRINTELLIX) 20 MG TABS tablet TAKE 1 TABLET(20 MG) BY MOUTH DAILY 08/07/23   Joan Flores, NP    Family History Family History  Problem Relation Age of Onset   Healthy Mother        Living   Healthy Father        Living   Lung cancer Maternal Grandfather    Throat cancer Maternal Grandfather    Cancer Maternal Uncle    Healthy Brother        x3   Heart attack Brother     Social History Social History   Tobacco Use   Smoking  status: Former    Types: Cigarettes   Smokeless tobacco: Never   Tobacco comments:    Quit >5 yrs  Vaping Use   Vaping status: Never Used  Substance Use Topics   Alcohol use: Yes    Alcohol/week: 0.0 standard drinks of alcohol    Comment: rare   Drug use: No     Allergies   Patient has no known allergies.   Review of Systems Review of Systems   Physical Exam Triage Vital Signs ED Triage Vitals [08/24/23 1507]  Encounter Vitals Group     BP (!) 160/97     Systolic BP Percentile      Diastolic BP Percentile      Pulse Rate 99     Resp 18     Temp 99 F (37.2 C)     Temp Source Temporal     SpO2 100 %     Weight      Height      Head Circumference      Peak Flow      Pain Score 6     Pain Loc      Pain Education       Exclude from Growth Chart    No data found.  Updated Vital Signs BP (!) 160/97 (BP Location: Left Arm)   Pulse 99   Temp 99 F (37.2 C) (Temporal)   Resp 18   SpO2 100%   Visual Acuity Right Eye Distance:   Left Eye Distance:   Bilateral Distance:    Right Eye Near:   Left Eye Near:    Bilateral Near:     Physical Exam Constitutional:      Appearance: Normal appearance.  Eyes:     Extraocular Movements: Extraocular movements intact.  Pulmonary:     Effort: Pulmonary effort is normal.  Genitourinary:      Comments: 1 x 1 cm pilonidal cyst present, less than 0.5 cm opening to the center, no drainage at this time Neurological:     Mental Status: He is alert and oriented to person, place, and time.      UC Treatments / Results  Labs (all labs ordered are listed, but only abnormal results are displayed) Labs Reviewed - No data to display  EKG   Radiology No results found.  Procedures Procedures (including critical care time)  Medications Ordered in UC Medications - No data to display  Initial Impression / Assessment and Plan / UC Course  I have reviewed the triage vital signs and the nursing notes.  Pertinent labs & imaging results that were available during my care of the patient were reviewed by me and considered in my medical decision making (see chart for details).  Pilonidal abscess  Presentation consistent with above diagnosis, no drainage at this time however as site has already opened and expel fluid will not complete I&D today, discussed, prescribed doxycycline recommended warm compresses, advised discontinuation of zinc oxide paste since that is causing pain and recommended petroleum as an alternative, recommended follow-up for nonhealing site Final Clinical Impressions(s) / UC Diagnoses   Final diagnoses:  Pilonidal abscess     Discharge Instructions      Will not need assisted drainage as site has already been draining on its own  Take  doxycyline every morning and every evening for 7 days  Hold warm-hot compresses to affected area at least 4 times a day, this helps to facilitate draining, the more the better  Please return for  evaluation for increased swelling, increased tenderness or pain, non healing site, non draining site, you begin to have fever or chills   We reviewed the etiology of recurrent abscesses of skin.  Skin abscesses are collections of pus within the dermis and deeper skin tissues. Skin abscesses manifest as painful, tender, fluctuant, and erythematous nodules, frequently surmounted by a pustule and surrounded by a rim of erythematous swelling.  Spontaneous drainage of purulent material may occur.  Fever can occur on occasion.    -Skin abscesses can develop in healthy individuals with no predisposing conditions other than skin or nasal carriage of Staphylococcus aureus.  Individuals in close contact with others who have active infection with skin abscesses are at increased risk which is likely to explain why twin brother has similar episodes.   In addition, any process leading to a breach in the skin barrier can also predispose to the development of a skin abscesses, such as atopic dermatitis.      ED Prescriptions     Medication Sig Dispense Auth. Provider   doxycycline (VIBRAMYCIN) 100 MG capsule Take 1 capsule (100 mg total) by mouth 2 (two) times daily. 14 capsule Anam Bobby, Elita Boone, NP      PDMP not reviewed this encounter.   Valinda Hoar, NP 08/24/23 1529

## 2023-08-24 NOTE — Discharge Instructions (Signed)
 Will not need assisted drainage as site has already been draining on its own  Take doxycyline every morning and every evening for 7 days  Hold warm-hot compresses to affected area at least 4 times a day, this helps to facilitate draining, the more the better  Please return for evaluation for increased swelling, increased tenderness or pain, non healing site, non draining site, you begin to have fever or chills   We reviewed the etiology of recurrent abscesses of skin.  Skin abscesses are collections of pus within the dermis and deeper skin tissues. Skin abscesses manifest as painful, tender, fluctuant, and erythematous nodules, frequently surmounted by a pustule and surrounded by a rim of erythematous swelling.  Spontaneous drainage of purulent material may occur.  Fever can occur on occasion.    -Skin abscesses can develop in healthy individuals with no predisposing conditions other than skin or nasal carriage of Staphylococcus aureus.  Individuals in close contact with others who have active infection with skin abscesses are at increased risk which is likely to explain why twin brother has similar episodes.   In addition, any process leading to a breach in the skin barrier can also predispose to the development of a skin abscesses, such as atopic dermatitis.

## 2023-09-04 ENCOUNTER — Ambulatory Visit: Admitting: Mental Health

## 2023-09-06 ENCOUNTER — Telehealth: Payer: Self-pay | Admitting: Behavioral Health

## 2023-09-06 ENCOUNTER — Other Ambulatory Visit: Payer: Self-pay

## 2023-09-06 DIAGNOSIS — F902 Attention-deficit hyperactivity disorder, combined type: Secondary | ICD-10-CM

## 2023-09-06 MED ORDER — AMPHETAMINE-DEXTROAMPHETAMINE 20 MG PO TABS: 20.0000 mg | ORAL_TABLET | Freq: Every day | ORAL | 0 refills | Status: DC

## 2023-09-06 MED ORDER — AMPHETAMINE-DEXTROAMPHET ER 20 MG PO CP24: 20.0000 mg | ORAL_CAPSULE | Freq: Every day | ORAL | 0 refills | Status: DC

## 2023-09-06 NOTE — Telephone Encounter (Signed)
 Pended both Adderall Rx. Notified patient he has RF on buspirone.

## 2023-09-06 NOTE — Telephone Encounter (Signed)
 Patient lvm requesting refills on both his Adderall's and the Buspirone. Fill at the [facility] #[id number] - [place], [name] - [address] AT [facility] [address] [facility] [address] [place], [name] [address] Phone: [phone]  Fax: [phone]   Appointment 12/31/23

## 2023-10-01 ENCOUNTER — Ambulatory Visit (INDEPENDENT_AMBULATORY_CARE_PROVIDER_SITE_OTHER): Admitting: Mental Health

## 2023-10-01 DIAGNOSIS — F411 Generalized anxiety disorder: Secondary | ICD-10-CM | POA: Diagnosis not present

## 2023-10-01 NOTE — Progress Notes (Signed)
 [facility]  Name: Joshua Fry Date:   10/01/23 MRN: [id number] DOB: 08/15/1985 PCP: Alda Amas, PA-C  Time spent: 50 minutes  Time in: 1: 00pm time out: 1:50pm   Treatment:  individual therapy   Mental Status Exam:    Appearance:    Casual     Behavior:   Appropriate  Motor:   WNL  Speech/Language:    Clear and Coherent  Affect:   Full range   Mood:   Euthymic, pleasant  Thought process:   Logical, linear, goal directed  Thought content:     WNL  Sensory/Perceptual disturbances:     none  Orientation:   x4  Attention:   Good  Concentration:   Good  Memory:   Intact  Fund of knowledge:    Consistent with age and development  Insight:     Good  Judgment:    Good  Impulse Control:   Good     Reported Symptoms:  sleep problems, irritability, panic attacks, agitation  Risk Assessment: Danger to Self:  No Self-injurious Behavior: No Danger to Others: No Duty to Warn:no Physical Aggression / Violence:No  Access to Firearms a concern: No  Gang Involvement:No  Patient / guardian was educated about steps to take if suicide or homicide risk level increases between visits: yes While future psychiatric events cannot be accurately predicted, the patient does not currently require acute inpatient psychiatric care and does not currently meet [place]  involuntary commitment criteria.  Subjective: Patient arrived on time for today's session.  Assessed progress since last visit which was approximately 2 months ago.  Patient shared continued work-related stress, feeling a sense of burnout recently going on to share some details.  He stated that he felt this way last year however, less so this year.  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  He continues to make attempts to cope and care for himself by trying to relax in the evenings and on weekends, engaged in diaphragmatic breathing, reporting  this is helpful and plans to reengage again between visits.  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   Interventions:   motivational interviewing, CBT, mindfulness strategies   Diagnoses:    ICD-10-CM   1. Generalized anxiety disorder  F41.1         Plan: Patient is to use CBT, mindfulness and coping skills to help manage /decrease symptoms.      Long-term goal:  Reduce overall level, frequency, and intensity of the feelings of depression, anxiety and panic so that daily functioning is not impaired.  Short-term goal: To identify and process feelings related to the disappointment of past painful events                    Improve ability to relax by utilizing coping skills as discussed        Identify and utilize emotional regulatory skills    Assessment of progress:  progressing     Avram Lenis, [facility]

## 2023-10-10 ENCOUNTER — Telehealth: Payer: Self-pay | Admitting: Behavioral Health

## 2023-10-10 ENCOUNTER — Other Ambulatory Visit: Payer: Self-pay

## 2023-10-10 DIAGNOSIS — F902 Attention-deficit hyperactivity disorder, combined type: Secondary | ICD-10-CM

## 2023-10-10 NOTE — Telephone Encounter (Signed)
 Pt called asking for a refill on his adderall xr 20 mg and adderall 20 mg. Pharmacy is [facility] [address] in [place]

## 2023-10-10 NOTE — Telephone Encounter (Signed)
 Pended both Adderall doses to [facility] in [place]

## 2023-10-11 MED ORDER — AMPHETAMINE-DEXTROAMPHETAMINE 20 MG PO TABS: 20.0000 mg | ORAL_TABLET | Freq: Every day | ORAL | 0 refills | Status: DC

## 2023-10-11 MED ORDER — AMPHETAMINE-DEXTROAMPHET ER 20 MG PO CP24: 20.0000 mg | ORAL_CAPSULE | Freq: Every day | ORAL | 0 refills | Status: DC

## 2023-10-30 ENCOUNTER — Ambulatory Visit: Admitting: Mental Health

## 2023-11-05 ENCOUNTER — Ambulatory Visit: Admitting: Mental Health

## 2023-11-05 DIAGNOSIS — F331 Major depressive disorder, recurrent, moderate: Secondary | ICD-10-CM | POA: Diagnosis not present

## 2023-11-05 DIAGNOSIS — F411 Generalized anxiety disorder: Secondary | ICD-10-CM | POA: Diagnosis not present

## 2023-11-05 NOTE — Progress Notes (Signed)
 [facility] note  Name: Joshua Fry Date:   11/05/23 MRN: [id number] DOB: 25-May-1986 PCP: Alda Amas, PA-C  Time spent: 51 minutes  Time in: 1: 00pm time out: 1:51pm   Treatment:  individual therapy   Mental Status Exam:    Appearance:    Casual     Behavior:   Appropriate  Motor:   WNL  Speech/Language:    Clear and Coherent  Affect:   Full range   Mood:   Depressed, pleasant  Thought process:   Logical, linear, goal directed  Thought content:     WNL  Sensory/Perceptual disturbances:     none  Orientation:   x4  Attention:   Good  Concentration:   Good  Memory:   Intact  Fund of knowledge:    Consistent with age and development  Insight:     Good  Judgment:    Good  Impulse Control:   Good     Reported Symptoms:  sleep problems, irritability, panic attacks, agitation, depressed mood, low motivation, anhedonia  Risk Assessment: Danger to Self:  No Self-injurious Behavior: No Danger to Others: No Duty to Warn:no Physical Aggression / Violence:No  Access to Firearms a concern: No  Gang Involvement:No  Patient / guardian was educated about steps to take if suicide or homicide risk level increases between visits: yes While future psychiatric events cannot be accurately predicted, the patient does not currently require acute inpatient psychiatric care and does not currently meet [place]  involuntary commitment criteria.  Subjective: Patient arrived on time for today's session. Assessed progress where he shared how He continues to struggle at times with motivation. Continues to identify a sense of burnout from work. He reported that he continues to be effective at work, fulfilling his role thoroughly in the company however, feels he's lost some of his drive and motivation over the last one to two years. Explored his engaging in pledgeable activities outside of work where he continues to play golf as one of his main outlets. Reports the  relationship with his wife continues to go well, she continues to pursue her college degree in which he is highly supportive. He shared how he and his wife plan to visit his brother who lives on [address], this marks the 5th year since his brother had a heart attack. He continues to attend his doctor appointments as an additional way to cook and care for himself. Facilitated his identifying associated thoughts related to his mood, and his specific identification of feeling less motivated, less enthusiastic. He was able to further identify his considerable work history, specific also to his current company and how he has been highly driven for many consecutive years and how this could play a role in how  and he has been feeling.    Interventions:   motivational interviewing, CBT, mindfulness strategies   Diagnoses:    ICD-10-CM   1. Generalized anxiety disorder  F41.1     2. Major depressive disorder, recurrent episode, moderate (HCC)  F33.1          Plan: Patient is to use CBT, mindfulness and coping skills to help manage /decrease symptoms.      Long-term goal:  Reduce overall level, frequency, and intensity of the feelings of depression, anxiety and panic so that daily functioning is not impaired.  Short-term goal: To identify and process feelings related to the disappointment of past painful events  Improve ability to relax by utilizing coping skills as discussed        Identify and utilize emotional regulatory skills    Assessment of progress:  progressing     Avram Lenis, [facility]

## 2023-11-12 ENCOUNTER — Telehealth: Payer: Self-pay | Admitting: Behavioral Health

## 2023-11-12 ENCOUNTER — Other Ambulatory Visit: Payer: Self-pay

## 2023-11-12 DIAGNOSIS — F902 Attention-deficit hyperactivity disorder, combined type: Secondary | ICD-10-CM

## 2023-11-12 MED ORDER — AMPHETAMINE-DEXTROAMPHET ER 20 MG PO CP24
20.0000 mg | ORAL_CAPSULE | Freq: Every day | ORAL | 0 refills | Status: DC
Start: 2023-11-12 — End: 2024-03-10

## 2023-11-12 MED ORDER — AMPHETAMINE-DEXTROAMPHETAMINE 20 MG PO TABS
20.0000 mg | ORAL_TABLET | Freq: Every day | ORAL | 0 refills | Status: DC
Start: 2023-11-12 — End: 2024-03-10

## 2023-11-12 MED ORDER — AMPHETAMINE-DEXTROAMPHETAMINE 20 MG PO TABS
20.0000 mg | ORAL_TABLET | Freq: Every day | ORAL | 0 refills | Status: DC
Start: 2023-12-10 — End: 2024-04-11

## 2023-11-12 MED ORDER — AMPHETAMINE-DEXTROAMPHET ER 20 MG PO CP24: 20.0000 mg | ORAL_CAPSULE | Freq: Every day | ORAL | 0 refills | Status: DC

## 2023-11-12 MED ORDER — AMPHETAMINE-DEXTROAMPHETAMINE 20 MG PO TABS: 20.0000 mg | ORAL_TABLET | Freq: Every day | ORAL | 0 refills | Status: DC

## 2023-11-12 NOTE — Telephone Encounter (Signed)
 Pended both doses of Adderall to [facility] in [place].

## 2023-11-12 NOTE — Telephone Encounter (Signed)
 Pt called requesting Rx for adderall xr 20 mg and adderall ir 20mg  to [facility] [address] [place]

## 2023-12-05 ENCOUNTER — Ambulatory Visit: Admitting: Mental Health

## 2023-12-05 DIAGNOSIS — F411 Generalized anxiety disorder: Secondary | ICD-10-CM

## 2023-12-05 NOTE — Progress Notes (Signed)
 [facility] note  Name: Joshua Fry Date:   12/05/23 MRN: [id number] DOB: 02-13-1986 PCP: Kathrene Mardy CHRISTELLA, PA-C  Time spent: 50 minutes  Time in: 1: 00pm time out: 1:51pm   Treatment:  individual therapy   Mental Status Exam:    Appearance:    Casual     Behavior:   Appropriate  Motor:   WNL  Speech/Language:    Clear and Coherent  Affect:   Full range   Mood:   Depressed, pleasant  Thought process:   Logical, linear, goal directed  Thought content:     WNL  Sensory/Perceptual disturbances:     none  Orientation:   x4  Attention:   Good  Concentration:   Good  Memory:   Intact  Fund of knowledge:    Consistent with age and development  Insight:     Good  Judgment:    Good  Impulse Control:   Good     Reported Symptoms:  sleep problems, irritability, panic attacks, agitation, depressed mood, low motivation, anhedonia  Risk Assessment: Danger to Self:  No Self-injurious Behavior: No Danger to Others: No Duty to Warn:no Physical Aggression / Violence:No  Access to Firearms a concern: No  Gang Involvement:No  Patient / guardian was educated about steps to take if suicide or homicide risk level increases between visits: yes While future psychiatric events cannot be accurately predicted, the patient does not currently require acute inpatient psychiatric care and does not currently meet [place]  involuntary commitment criteria.  Subjective: Patient arrived on time for today's session.  He continues to cope with work-related stress.  He stated that he continues to struggle with motivation at times although he keeps up with all responsibilities of his job.  He has a high work ethic and follows through consistently, although he has to attend to multiple responsibilities that are often outside the scope of his job specific duties.  He stated that his supervisors often ineffective and at times incompetent.  Patient went on to give details, examples.   Through further guided discovery, he did not feel the need to focus on his responsibilities while not adding additional tasks outside his purview unless necessary.  He identified how he hopes this will help reduce his stress and ensure that others are following through with their work test results.   Interventions:   motivational interviewing, CBT, mindfulness strategies   Diagnoses:    ICD-10-CM   1. Generalized anxiety disorder  F41.1        Plan: Patient is to use CBT, mindfulness and coping skills to help manage /decrease symptoms.      Long-term goal:  Reduce overall level, frequency, and intensity of the feelings of depression, anxiety and panic so that daily functioning is not impaired.  Short-term goal: To identify and process feelings related to the disappointment of past painful events                    Improve ability to relax by utilizing coping skills as discussed        Identify and utilize emotional regulatory skills    Assessment of progress:  progressing     Lonni Fischer, [facility]

## 2023-12-31 ENCOUNTER — Ambulatory Visit: Admitting: Behavioral Health

## 2023-12-31 ENCOUNTER — Encounter: Payer: Self-pay | Admitting: Behavioral Health

## 2023-12-31 DIAGNOSIS — F331 Major depressive disorder, recurrent, moderate: Secondary | ICD-10-CM | POA: Diagnosis not present

## 2023-12-31 DIAGNOSIS — F411 Generalized anxiety disorder: Secondary | ICD-10-CM | POA: Diagnosis not present

## 2023-12-31 DIAGNOSIS — F4310 Post-traumatic stress disorder, unspecified: Secondary | ICD-10-CM

## 2023-12-31 MED ORDER — VORTIOXETINE HBR 20 MG PO TABS: ORAL_TABLET | ORAL | 5 refills | Status: AC

## 2023-12-31 MED ORDER — BUSPIRONE HCL 15 MG PO TABS: 15.0000 mg | ORAL_TABLET | Freq: Two times a day (BID) | ORAL | 1 refills | Status: AC

## 2023-12-31 NOTE — Progress Notes (Signed)
 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  He reinitiated his ADHD medication. For now, no other changes with meds. Eventually he would like to try to wean off Trintellix . He says his anxiety today is 2/10 and depression is 2/10. Requesting no medication changes today.    He denies mania, no auditory or visual hallucinations. Denies SI or HI. [facility].    Past psychiatric medication trials: Zoloft  Lexapro Trintellix   Adderall- [employer] shortage Vyvanse   Individual Medical History/ Review of Systems: Changes? :No   Allergies: Patient has no known allergies.  Current Medications:  Current Outpatient Medications:    amLODipine (NORVASC) 5 MG tablet, Take 1 tablet by mouth daily., Disp: , Rfl:    amphetamine -dextroamphetamine  (ADDERALL XR) 20 MG 24 hr capsule, Take 1 capsule (20 mg total) by mouth daily., Disp: 30 capsule, Rfl: 0   amphetamine -dextroamphetamine  (ADDERALL XR) 20 MG 24 hr capsule, Take 1 capsule (20 mg total) by mouth daily., Disp: 30 capsule, Rfl: 0   [START ON 01/07/2024] amphetamine -dextroamphetamine  (ADDERALL XR) 20 MG 24 hr capsule, Take 1 capsule (20 mg total) by mouth daily., Disp: 30 capsule, Rfl: 0   amphetamine -dextroamphetamine  (ADDERALL) 20 MG tablet, Take 1 tablet (20 mg total) by mouth daily., Disp: 30 tablet, Rfl: 0   amphetamine -dextroamphetamine  (ADDERALL) 20 MG tablet, Take 1 tablet (20 mg total) by mouth daily., Disp: 30 tablet, Rfl: 0   [START ON 01/07/2024] amphetamine -dextroamphetamine  (ADDERALL) 20 MG tablet, Take 1 tablet (20 mg total) by mouth daily., Disp: 30 tablet, Rfl:  0   azelastine  (ASTELIN ) 0.1 % nasal spray, U 1 SPR IEN BID, Disp: , Rfl:    busPIRone  (BUSPAR ) 15 MG tablet, Take 1 tablet (15 mg total) by mouth 2 (two) times daily., Disp: 180 tablet, Rfl: 1   chlorpheniramine-HYDROcodone (TUSSIONEX PENNKINETIC ER) 10-8 MG/5ML, Take 5 mLs by mouth at bedtime as needed for cough., Disp: 115 mL, Rfl: 0   doxycycline  (VIBRAMYCIN ) 100 MG capsule, Take 1 capsule (100 mg total) by mouth 2 (two) times daily., Disp: 14 capsule, Rfl: 0   fluticasone (FLONASE) 50 MCG/ACT nasal spray, SHAKE LQ AND U 1 TO 2 SPRAYS IEN QD, Disp: , Rfl:    omeprazole  (PRILOSEC) 20 MG capsule, take 1 capsule by mouth once daily, Disp: 30 capsule, Rfl: 3   predniSONE  (DELTASONE ) 50 MG tablet, Take 1 tablet (50 mg total) by mouth daily., Disp: 3 tablet, Rfl: 0   vortioxetine  HBr (TRINTELLIX ) 20 MG TABS tablet, TAKE 1 TABLET(20 MG) BY MOUTH DAILY, Disp: 30 tablet, Rfl: 5 Medication Side Effects: none  Family Medical/ Social History: Changes? No  MENTAL HEALTH EXAM:  There were no vitals taken for this visit.There is no height or weight on file to calculate BMI.  General Appearance: Casual, Neat, and Well Groomed  Eye Contact:  Good  Speech:  Clear and Coherent  Volume:  Normal  Mood:  NA  Affect:  Appropriate  Thought Process:  Coherent  Orientation:  Full (Time, Place, and Person)  Thought Content: Logical   Suicidal Thoughts:  No  Homicidal Thoughts:  No  Memory:  WNL  Judgement:  Good  Insight:  Good  Psychomotor Activity:  Normal  Concentration:  Concentration: Good  Recall:  Good  Fund of Knowledge: Good  Language: Good  Assets:  Desire for Improvement  ADL's:  Intact  Cognition: WNL  Prognosis:  Good    DIAGNOSES:    ICD-10-CM   1. Major depressive disorder, recurrent episode, moderate (HCC)  F33.1 vortioxetine  HBr (TRINTELLIX ) 20 MG TABS tablet    busPIRone  (BUSPAR ) 15 MG tablet    2. Generalized anxiety disorder  F41.1 vortioxetine  HBr (TRINTELLIX ) 20 MG TABS  tablet    busPIRone  (BUSPAR ) 15 MG tablet    3. PTSD (post-traumatic stress disorder)  F43.10 busPIRone  (BUSPAR ) 15 MG tablet      Receiving Psychotherapy: Yes    RECOMMENDATIONS:   Greater than 50% of 30 min face to face time with patient was spent on counseling and coordination of care. Discussed his current level of stability. He is doing very well right now.  Request no medication changes this visit. Provided refills.  Continue with Adderall 20 mg XR daily in the am Continue with Adderall 20 mg IR daily for boost To continue Trintellix  20 mg daily Will report any side effects or worsening symptoms promptly Will follow up in 6 months to reassess Provided emergency contact information Discussed potential benefits, risks, and side effects of stimulants with patient to include increased heart rate, palpitations, insomnia, increased anxiety, increased irritability, or decreased appetite.  Instructed patient to contact office if experiencing any significant tolerability issues.  Reviewed PDMP         Redell DELENA Pizza, NP

## 2024-01-02 ENCOUNTER — Ambulatory Visit: Admitting: Mental Health

## 2024-01-30 ENCOUNTER — Ambulatory Visit: Admitting: Mental Health

## 2024-01-30 DIAGNOSIS — F331 Major depressive disorder, recurrent, moderate: Secondary | ICD-10-CM | POA: Diagnosis not present

## 2024-01-30 NOTE — Progress Notes (Signed)
 [facility]  Name: Joshua Fry Date:   01/30/24 MRN: [id number] DOB: February 23, 1986 PCP: Kathrene Mardy CHRISTELLA, PA-C  Time spent: 51 minutes  Time in: 1: 00pm time out: 1:51pm   Treatment:  individual therapy   Mental Status Exam:    Appearance:    Casual     Behavior:   Appropriate  Motor:   WNL  Speech/Language:    Clear and Coherent  Affect:   Full range   Mood:   Depressed, pleasant  Thought process:   Logical, linear, goal directed  Thought content:     WNL  Sensory/Perceptual disturbances:     none  Orientation:   x4  Attention:   Good  Concentration:   Good  Memory:   Intact  Fund of knowledge:    Consistent with age and development  Insight:     Good  Judgment:    Good  Impulse Control:   Good     Reported Symptoms:  sleep problems, irritability, panic attacks, agitation, depressed mood, low motivation, anhedonia  Risk Assessment: Danger to Self:  No Self-injurious Behavior: No Danger to Others: No Duty to Warn:no Physical Aggression / Violence:No  Access to Firearms a concern: No  Gang Involvement:No  Patient / guardian was educated about steps to take if suicide or homicide risk level increases between visits: yes While future psychiatric events cannot be accurately predicted, the patient does not currently require acute inpatient psychiatric care and does not currently meet [place]  involuntary commitment criteria.  Subjective: Patient arrived on time for today's session.  Patient shared he continues to have some work-related stress but keeping up well.  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  He shared one experience, Most notably related to a specific incident. He shared how he may have this nightmare occur periodically, often if he does not use cannabis he is not that this or somewhat related dreams occur. Facilitated his identifying  associated feelings where he identified regret and guilt. Assisted him in framing the situation differently as he was able to share how he was uncertain of all the conditions at that time, was not given a direct order, therefore made a decision based on what he knew at that time.    Interventions:   motivational interviewing, CBT, mindfulness strategies   Diagnoses:    ICD-10-CM   1. Major depressive disorder, recurrent episode, moderate (HCC)  F33.1         Plan: Patient is to use CBT, mindfulness and coping skills to help manage /decrease symptoms.      Long-term goal:  Reduce overall level, frequency, and intensity of the feelings of depression, anxiety and panic so that daily functioning is not impaired.  Short-term goal: To identify and process feelings related to the disappointment of past painful events                    Improve ability to relax by utilizing coping skills as discussed        Identify and utilize emotional regulatory skills    Assessment of progress:  progressing     Lonni Fischer, [facility]

## 2024-02-27 ENCOUNTER — Ambulatory Visit: Admitting: Mental Health

## 2024-03-06 ENCOUNTER — Other Ambulatory Visit: Payer: Self-pay | Admitting: Behavioral Health

## 2024-03-06 DIAGNOSIS — F4310 Post-traumatic stress disorder, unspecified: Secondary | ICD-10-CM

## 2024-03-06 DIAGNOSIS — F331 Major depressive disorder, recurrent, moderate: Secondary | ICD-10-CM

## 2024-03-06 DIAGNOSIS — F411 Generalized anxiety disorder: Secondary | ICD-10-CM

## 2024-03-10 ENCOUNTER — Ambulatory Visit: Admitting: Mental Health

## 2024-03-10 ENCOUNTER — Telehealth: Payer: Self-pay | Admitting: Behavioral Health

## 2024-03-10 ENCOUNTER — Other Ambulatory Visit: Payer: Self-pay

## 2024-03-10 DIAGNOSIS — F411 Generalized anxiety disorder: Secondary | ICD-10-CM

## 2024-03-10 DIAGNOSIS — F4310 Post-traumatic stress disorder, unspecified: Secondary | ICD-10-CM

## 2024-03-10 DIAGNOSIS — F902 Attention-deficit hyperactivity disorder, combined type: Secondary | ICD-10-CM

## 2024-03-10 MED ORDER — AMPHETAMINE-DEXTROAMPHET ER 20 MG PO CP24: 20.0000 mg | ORAL_CAPSULE | Freq: Every day | ORAL | 0 refills | Status: DC

## 2024-03-10 MED ORDER — AMPHETAMINE-DEXTROAMPHETAMINE 20 MG PO TABS: 20.0000 mg | ORAL_TABLET | Freq: Every day | ORAL | 0 refills | Status: DC

## 2024-03-10 NOTE — Progress Notes (Signed)
 [facility]  Name: Joshua Fry Date:  03/10/24 MRN: [id number] DOB: 1985/08/09 PCP: Kathrene Mardy CHRISTELLA, PA-C  Time spent: 50 minutes  Time in: 1: 00pm time out: 1:50pm   Treatment:  individual therapy   Mental Status Exam:    Appearance:    Casual     Behavior:   Appropriate  Motor:   WNL  Speech/Language:    Clear and Coherent  Affect:   Full range   Mood:   Depressed, pleasant  Thought process:   Logical, linear, goal directed  Thought content:     WNL  Sensory/Perceptual disturbances:     none  Orientation:   x4  Attention:   Good  Concentration:   Good  Memory:   Intact  Fund of knowledge:    Consistent with age and development  Insight:     Good  Judgment:    Good  Impulse Control:   Good     Reported Symptoms:  sleep problems, irritability, panic attacks, agitation, depressed mood, low motivation, anhedonia  Risk Assessment: Danger to Self:  No Self-injurious Behavior: No Danger to Others: No Duty to Warn:no Physical Aggression / Violence:No  Access to Firearms a concern: No  Gang Involvement:No  Patient / guardian was educated about steps to take if suicide or homicide risk level increases between visits: yes While future psychiatric events cannot be accurately predicted, the patient does not currently require acute inpatient psychiatric care and does not currently meet [place]  involuntary commitment criteria.  Subjective: Patient arrived on time for today's session.  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  Explored ways he feels that he can find a balance between work and home to allow for a better balance and reducing his stress and anxiety.  He shared more history related to his time in the [employer], going through [employer] and experiences with deployment.   Provide support and understanding as he detailed experiences and interaction with others who were also in the [employer], some of which have passed away.  He made the connection between some of the training he endured at that time to be needed but also the impact it has on him presently.  Ways to destress and relax continue to be a struggle for him he feels due to this history.  Provide some psychoeducation related to sympathetic and parasympathetic nervous system responses.  Benefits of engaging in mindfulness exercises as well as other strategies to engage the parasympathetic response; reviewed how taking time to engage in this process, grounding exercises can be helpful.  Interventions:   motivational interviewing, CBT, mindfulness strategies   Diagnoses:    ICD-10-CM   1. Generalized anxiety disorder  F41.1     2. PTSD (post-traumatic stress disorder)  F43.10          Plan: Patient is to use CBT, mindfulness and coping skills to help manage /decrease symptoms.      Long-term goal:  Reduce overall level, frequency, and intensity of the feelings of depression, anxiety and panic so that daily functioning is not impaired.  Short-term goal: To identify and process feelings related to the disappointment of past painful events                    Improve ability to relax by utilizing coping skills as discussed  Identify and utilize emotional regulatory skills    Assessment of progress:  progressing     Lonni Fischer, [facility]

## 2024-03-10 NOTE — Telephone Encounter (Signed)
 Pended

## 2024-03-10 NOTE — Telephone Encounter (Signed)
 Pt called requesting Rx Adderall XR 20 mg and Adderall IR 20 mg. [facility] [place]. [address]

## 2024-03-18 ENCOUNTER — Other Ambulatory Visit: Payer: Self-pay | Admitting: Internal Medicine

## 2024-03-18 DIAGNOSIS — Z8249 Family history of ischemic heart disease and other diseases of the circulatory system: Secondary | ICD-10-CM

## 2024-03-27 ENCOUNTER — Ambulatory Visit
Admission: RE | Admit: 2024-03-27 | Discharge: 2024-03-27 | Disposition: A | Discharge status: Home / Self Care | Payer: Self-pay | Source: Ambulatory Visit | Attending: Internal Medicine | Admitting: Internal Medicine

## 2024-03-27 DIAGNOSIS — Z8249 Family history of ischemic heart disease and other diseases of the circulatory system: Secondary | ICD-10-CM | POA: Insufficient documentation

## 2024-04-02 ENCOUNTER — Ambulatory Visit: Admitting: Mental Health

## 2024-04-02 DIAGNOSIS — F902 Attention-deficit hyperactivity disorder, combined type: Secondary | ICD-10-CM

## 2024-04-02 DIAGNOSIS — F411 Generalized anxiety disorder: Secondary | ICD-10-CM

## 2024-04-02 NOTE — Progress Notes (Signed)
 [facility]  Name: Joshua Fry Date:  04/02/24 MRN: [id number] DOB: [birth year] PCP: Kathrene Mardy CHRISTELLA, PA-C  Time spent: 50 minutes  Time in: 1: 00pm time out: 1:50pm   Treatment:  individual therapy   Mental Status Exam:    Appearance:    Casual     Behavior:   Appropriate  Motor:   WNL  Speech/Language:    Clear and Coherent  Affect:   Full range   Mood:   Depressed, pleasant  Thought process:   Logical, linear, goal directed  Thought content:     WNL  Sensory/Perceptual disturbances:     none  Orientation:   x4  Attention:   Good  Concentration:   Good  Memory:   Intact  Fund of knowledge:    Consistent with age and development  Insight:     Good  Judgment:    Good  Impulse Control:   Good     Reported Symptoms:  sleep problems, irritability, panic attacks, agitation, depressed mood, low motivation, anhedonia  Risk Assessment: Danger to Self:  No Self-injurious Behavior: No Danger to Others: No Duty to Warn:no Physical Aggression / Violence:No  Access to Firearms a concern: No  Gang Involvement:No  Patient / guardian was educated about steps to take if suicide or homicide risk level increases between visits: yes While future psychiatric events cannot be accurately predicted, the patient does not currently require acute inpatient psychiatric care and does not currently meet [place]  involuntary commitment criteria.  Subjective: Patient arrived on time for today's session.  Assessed progress.  He shared recent stressors.  Requesting work continued significant detail related to how he is responsible for many aspects of daily use with projects.  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  He is engaging while he is in his position and how ultimately he does not have enough knowledge.  He stated that he often has to manage his job tasks and his supervisors as well.  Examples were shared as  well as situations where he shared how he has to stay consistent with his knowledge and convictions about how projects should be run regardless of his supervisors lack of experience.  He identified the need to continue to be mindful of outlets for stress management outside of work.  He stated that he often has been struggling with motivation particularly when off work getting him where he may play video games but will not engage in exercise which he states he has been struggling with for some time.  He stated that he had a recent medical evaluation that indicated higher levels of cholesterol where he stated he wants to begin working out again and focusing more on healthy diet.  Ways to get started were discussed.  Also, discussed benefits of distressing where he plans to go for walks during the day, taking a lunch break which he often does not allow himself to have.   Interventions:   motivational interviewing, CBT, mindfulness strategies   Diagnoses:    ICD-10-CM   1. Generalized anxiety disorder  F41.1     2. Attention deficit hyperactivity disorder (ADHD), combined type  F90.2           Plan: Patient is to use CBT, mindfulness and coping skills to help manage /decrease symptoms.      Long-term goal:  Reduce overall level, frequency, and intensity of the feelings of depression, anxiety and panic so that daily functioning is  not impaired.  Short-term goal: To identify and process feelings related to the disappointment of past painful events                    Improve ability to relax by utilizing coping skills as discussed        Identify and utilize emotional regulatory skills    Assessment of progress:  progressing     Lonni Fischer, [facility]

## 2024-04-11 ENCOUNTER — Telehealth: Payer: Self-pay | Admitting: Behavioral Health

## 2024-04-11 ENCOUNTER — Other Ambulatory Visit: Payer: Self-pay

## 2024-04-11 DIAGNOSIS — F902 Attention-deficit hyperactivity disorder, combined type: Secondary | ICD-10-CM

## 2024-04-11 MED ORDER — AMPHETAMINE-DEXTROAMPHETAMINE 20 MG PO TABS: 20.0000 mg | ORAL_TABLET | Freq: Every day | ORAL | 0 refills | Status: DC

## 2024-04-11 MED ORDER — AMPHETAMINE-DEXTROAMPHET ER 20 MG PO CP24
20.0000 mg | ORAL_CAPSULE | Freq: Every day | ORAL | 0 refills | Status: AC
Start: 2024-04-11 — End: 2024-05-11

## 2024-04-11 NOTE — Telephone Encounter (Signed)
 Pended both doses. Redell left early, sent to [place].

## 2024-04-11 NOTE — Telephone Encounter (Signed)
 Pt needs rf of Adderall XR and IR 20mg      [facility] [address]  [place]

## 2024-04-30 ENCOUNTER — Ambulatory Visit: Admitting: Mental Health

## 2024-05-28 ENCOUNTER — Other Ambulatory Visit: Payer: Self-pay

## 2024-05-28 ENCOUNTER — Telehealth: Payer: Self-pay | Admitting: Behavioral Health

## 2024-05-28 DIAGNOSIS — F902 Attention-deficit hyperactivity disorder, combined type: Secondary | ICD-10-CM

## 2024-05-28 MED ORDER — AMPHETAMINE-DEXTROAMPHET ER 20 MG PO CP24: 20.0000 mg | ORAL_CAPSULE | Freq: Every day | ORAL | 0 refills | Status: AC

## 2024-05-28 MED ORDER — AMPHETAMINE-DEXTROAMPHETAMINE 20 MG PO TABS: 20.0000 mg | ORAL_TABLET | Freq: Every day | ORAL | 0 refills | Status: AC

## 2024-05-28 NOTE — Telephone Encounter (Signed)
 Pt needs rf of both Adderall scripts   [facility] [address] [place]

## 2024-05-28 NOTE — Telephone Encounter (Signed)
 Pended 2 RF for both doses

## 2024-06-04 ENCOUNTER — Ambulatory Visit: Admitting: Mental Health

## 2024-06-04 DIAGNOSIS — F902 Attention-deficit hyperactivity disorder, combined type: Secondary | ICD-10-CM | POA: Diagnosis not present

## 2024-06-04 NOTE — Progress Notes (Signed)
 [facility] note  Name: Joshua Fry Date: 06/05/23 MRN: [id number] DOB: 1985/09/02 PCP: Kathrene Mardy CHRISTELLA, PA-C   Time spent: 45 minutes  Time in: 1: 05pm time out: 1:50pm   Treatment:  individual therapy   Mental Status Exam:    Appearance:    Casual     Behavior:   Appropriate  Motor:   WNL  Speech/Language:    Clear and Coherent  Affect:   Full range   Mood:   Euthymic  Thought process:   Logical, linear, goal directed  Thought content:     WNL  Sensory/Perceptual disturbances:     none  Orientation:   x4  Attention:   Good  Concentration:   Good  Memory:   Intact  Fund of knowledge:    Consistent with age and development  Insight:     Good  Judgment:    Good  Impulse Control:   Good     Reported Symptoms:  sleep problems, irritability, panic attacks, agitation, depressed mood, low motivation, anhedonia  Risk Assessment: Danger to Self:  No Self-injurious Behavior: No Danger to Others: No Duty to Warn:no Physical Aggression / Violence:No  Access to Firearms a concern: No  Gang Involvement:No  Patient / guardian was educated about steps to take if suicide or homicide risk level increases between visits: yes While future psychiatric events cannot be accurately predicted, the patient does not currently require acute inpatient psychiatric care and does not currently meet [place]  involuntary commitment criteria.  Subjective: Patient arrived on time for today's session.  Assessed progress.  Patient shared Christmas, was able to take about a week or so off which she stated was helpful due to increasing feelings of burnout from his job.  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  He stated he was able to meet with the company president to discuss logistics as well as other concerns.  He stated that he was given  some insurances along with an increase in pay which made him feel more secure in his role regarding his career and needed changes.  Ways to cope and care for himself were explored where he stated that he recently started back working out, going for walks which he found helpful for his mood and stress.  He plans to continue to follow through with*behaviors as discussed in previous visits to decrease procrastination and avoidance.  Interventions:   motivational interviewing, CBT, mindfulness strategies   Diagnoses:    ICD-10-CM   1. Attention deficit hyperactivity disorder (ADHD), combined type  F90.2            Plan: Patient is to use CBT, mindfulness and coping skills to help manage /decrease symptoms.      Long-term goal:  Reduce overall level, frequency, and intensity of the feelings of depression, anxiety and panic so that daily functioning is not impaired.  Short-term goal: To identify and process feelings related to the disappointment of past painful events                    Improve ability to relax by utilizing coping skills as discussed        Identify and utilize emotional regulatory skills    Assessment of progress:  progressing     Lonni Fischer, [facility]

## 2024-06-23 ENCOUNTER — Telehealth: Admitting: Behavioral Health

## 2024-06-23 ENCOUNTER — Encounter: Payer: Self-pay | Admitting: Behavioral Health

## 2024-06-23 DIAGNOSIS — F330 Major depressive disorder, recurrent, mild: Secondary | ICD-10-CM

## 2024-06-23 DIAGNOSIS — F411 Generalized anxiety disorder: Secondary | ICD-10-CM | POA: Diagnosis not present

## 2024-06-23 DIAGNOSIS — F4310 Post-traumatic stress disorder, unspecified: Secondary | ICD-10-CM

## 2024-06-23 DIAGNOSIS — F902 Attention-deficit hyperactivity disorder, combined type: Secondary | ICD-10-CM | POA: Diagnosis not present

## 2024-06-23 NOTE — Progress Notes (Signed)
 Joshua Fry [id number] December 07, 1985 39 y.o.  Virtual Visit via Telephone Note  I connected with pt on 06/23/24 at  1:30 PM EST by telephone and verified that I am speaking with the correct person using two identifiers.   I discussed the limitations, risks, security and privacy concerns of performing an evaluation and management service by telephone and the availability of in person appointments. I also discussed with the patient that there may be a patient responsible charge related to this service. The patient expressed understanding and agreed to proceed.   I discussed the assessment and treatment plan with the patient. The patient was provided an opportunity to ask questions and all were answered. The patient agreed with the plan and demonstrated an understanding of the instructions.   The patient was advised to call back or seek an in-person evaluation if the symptoms worsen or if the condition fails to improve as anticipated.  I provided 30 minutes of non-face-to-face time during this encounter.  The patient was located at home.  The provider was located at [facility].  Joshua DELENA Pizza, Joshua Fry   Subjective:   Patient ID:  Joshua Fry is a 39 y.o. (DOB 1985-11-05) male.  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  He is reporting some exacerbation with attention and focus problems during the day at work.  Things have been a little more stressful and it has been a little more difficult for him.  Says he is not sure if he is just getting burnout.  For now he is not wanting to adjust any of his medications.  He agrees to trial of magnesium glycinate or threonate.  Reports his anxiety today is 2/10 and depression is 2/10. Requesting no medication changes  today.    He denies mania, no auditory or visual hallucinations. Denies SI or HI. [facility].    Past psychiatric medication trials: Zoloft  Lexapro Trintellix   Adderall- [employer] shortage Vyvanse      Review of Systems:  Review of Systems  Constitutional: Negative.   Allergic/Immunologic: Negative.   Neurological: Negative.   Psychiatric/Behavioral:  Positive for decreased concentration.     Medications: I have reviewed the patient's current medications.  Current Outpatient Medications  Medication Sig Dispense Refill   amLODipine (NORVASC) 5 MG tablet Take 1 tablet by mouth daily.     amphetamine -dextroamphetamine  (ADDERALL XR) 20 MG 24 hr capsule Take 1 capsule (20 mg total) by mouth daily. 30 capsule 0   [START ON 06/25/2024] amphetamine -dextroamphetamine  (ADDERALL XR) 20 MG 24 hr capsule Take 1 capsule (20 mg total) by mouth daily. 30 capsule 0   amphetamine -dextroamphetamine  (ADDERALL XR) 20 MG 24 hr capsule Take 1 capsule (20 mg total) by mouth daily. 30 capsule 0   [START ON 06/25/2024] amphetamine -dextroamphetamine  (ADDERALL) 20 MG tablet Take 1 tablet (20 mg total) by mouth daily. 30 tablet 0   amphetamine -dextroamphetamine  (ADDERALL) 20 MG tablet Take 1 tablet (20 mg total) by mouth daily. 30 tablet 0   azelastine  (ASTELIN ) 0.1 % nasal spray U 1 SPR IEN BID     busPIRone  (BUSPAR ) 15 MG tablet Take 1 tablet (15 mg total) by mouth 2 (two) times daily. 180 tablet 1   chlorpheniramine-HYDROcodone (TUSSIONEX PENNKINETIC  ER) 10-8 MG/5ML Take 5 mLs by mouth at bedtime as needed for cough. 115 mL 0   doxycycline  (VIBRAMYCIN ) 100 MG capsule Take 1 capsule (100 mg total) by mouth 2 (two) times daily. 14 capsule 0   fluticasone (FLONASE) 50 MCG/ACT nasal spray SHAKE LQ AND U 1 TO 2 SPRAYS IEN QD     omeprazole  (PRILOSEC) 20 MG capsule take 1 capsule by mouth once daily 30 capsule 3   predniSONE  (DELTASONE ) 50 MG tablet Take 1 tablet (50 mg total) by mouth  daily. 3 tablet 0   vortioxetine  HBr (TRINTELLIX ) 20 MG TABS tablet TAKE 1 TABLET(20 MG) BY MOUTH DAILY 30 tablet 5   No current facility-administered medications for this visit.    Medication Side Effects: None  Allergies: Allergies[1]  Past Medical History:  Diagnosis Date   Acid reflux    History of chicken pox    Lyme disease    Unknown    Family History  Problem Relation Age of Onset   Healthy Mother        Living   Healthy Father        Living   Lung cancer Maternal Grandfather    Throat cancer Maternal Grandfather    Cancer Maternal Uncle    Healthy Brother        x3   Heart attack Brother     Social History   Socioeconomic History   Marital status: Married    Spouse name: Not on file   Number of children: Not on file   Years of education: Not on file   Highest education level: Not on file  Occupational History   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    Social Drivers of Health   Tobacco Use: Low Risk (02/20/2024)   Received from [facility]   Patient History    Smoking Tobacco Use: Never    Smokeless Tobacco Use: Never    Passive Exposure: Not on file  Recent Concern: Tobacco Use - Medium Risk (12/31/2023)   Patient History    Smoking Tobacco Use: Former    Smokeless Tobacco Use: Never    Passive Exposure: Not on [place] Strain: Not on file  Food Insecurity: Not on file  Transportation Needs: Not on file  Physical Activity: Not on file  Stress: Not on  file  Social Connections: Not on file  Intimate Partner Violence: Not on file  Depression (EYV7-0): Not on file  Alcohol Screen: Not on file  Housing: Not on file  Utilities: Not on file  Health Literacy: Not on file    Past Medical History, Surgical history, Social history, and Family history were reviewed and updated as appropriate.   Please see review of systems for further details on the patient's review from today.   Objective:   Physical Exam:  There were no vitals taken for this visit.  Physical Exam Constitutional:  General: He is not in acute distress.    Appearance: Normal appearance.  Neurological:     Mental Status: He is alert and oriented to person, place, and time.     Gait: Gait normal.  Psychiatric:        Attention and Perception: Attention and perception normal. He does not perceive auditory or visual hallucinations.        Mood and Affect: Mood and affect normal. Mood is not anxious or depressed. Affect is not labile.        Speech: Speech normal.        Behavior: Behavior normal. Behavior is cooperative.        Thought Content: Thought content normal.        Cognition and Memory: Cognition and memory normal.        Judgment: Judgment normal.     Lab Review:     Component Value Date/Time   NA 140 04/19/2020 1019   K 4.5 04/19/2020 1019   CL 104 04/19/2020 1019   CO2 25 04/19/2020 1019   GLUCOSE 82 04/19/2020 1019   GLUCOSE 119 (H) 12/10/2018 1543   BUN 10 04/19/2020 1019   CREATININE 0.89 04/19/2020 1019   CALCIUM 9.3 04/19/2020 1019   PROT 7.0 12/10/2018 1543   ALBUMIN 4.9 12/10/2018 1543   AST 22 12/10/2018 1543   ALT 21 12/10/2018 1543   ALKPHOS 64 12/10/2018 1543   BILITOT 0.8 12/10/2018 1543   GFRNONAA 113 04/19/2020 1019   GFRAA 131 04/19/2020 1019       Component Value Date/Time   WBC 5.1 04/19/2020 1019   WBC 7.8 12/10/2018 1543   RBC 4.82 04/19/2020 1019   RBC 4.76 12/10/2018 1543   HGB 14.8 04/19/2020 1019   HCT 42.9  04/19/2020 1019   PLT 191 04/19/2020 1019   MCV 89 04/19/2020 1019   MCH 30.7 04/19/2020 1019   MCHC 34.5 04/19/2020 1019   MCHC 33.9 12/10/2018 1543   RDW 11.7 04/19/2020 1019   LYMPHSABS 1.6 12/10/2018 1543   MONOABS 0.4 12/10/2018 1543   EOSABS 0.0 12/10/2018 1543   BASOSABS 0.0 12/10/2018 1543    No results found for: POCLITH, LITHIUM   No results found for: PHENYTOIN, PHENOBARB, VALPROATE, CBMZ   .res Assessment: Plan:    Greater than 50% of 30 min telephonic  time with patient was spent on counseling and coordination of care. Discussed his current level of stability.  Reporting some exacerbation of attention and focus problems.  Discussed his current challenges at work and feeling like he is getting burnout.  Educated him on possible benefits of magnesium.  Request no medication changes this visit. Provided refills.  Continue with Adderall 20 mg XR daily in the am Continue with Adderall 20 mg IR daily for boost To continue Trintellix  20 mg daily Will report any side effects or worsening symptoms promptly Will follow up in 6 months to reassess Provided emergency contact information Discussed potential benefits, risks, and side effects of stimulants with patient to include increased heart rate, palpitations, insomnia, increased anxiety, increased irritability, or decreased appetite.  Instructed patient to contact office if experiencing any significant tolerability issues.  Reviewed PDMP   Joshua LABOR. Teea Ducey, Joshua Fry    Elsie Bench was seen today for depression, anxiety, follow-up, medication refill, patient education and adhd.  Diagnoses and all orders for this visit:  Attention deficit hyperactivity disorder (ADHD), combined type  Generalized anxiety disorder  PTSD (post-traumatic stress disorder)  Mild episode of recurrent  major depressive disorder    Please see After Visit Summary for patient specific instructions.  Future Appointments  Date Time  Provider Department Center  07/02/2024  2:00 PM Bernardo Bruckner, [facility] None  07/30/2024  2:00 PM Bernardo Bruckner, [facility] None    No orders of the defined types were placed in this encounter.     -------------------------------     [1] No Known Allergies

## 2024-07-02 ENCOUNTER — Ambulatory Visit: Admitting: Behavioral Health

## 2024-07-02 ENCOUNTER — Ambulatory Visit: Admitting: Mental Health

## 2024-07-02 DIAGNOSIS — F902 Attention-deficit hyperactivity disorder, combined type: Secondary | ICD-10-CM

## 2024-07-02 DIAGNOSIS — F411 Generalized anxiety disorder: Secondary | ICD-10-CM | POA: Diagnosis not present

## 2024-07-02 NOTE — Progress Notes (Signed)
 [facility]  Name: Joshua Fry Date: 07/03/2023 MRN: [id number] DOB: [birth year] PCP: Kathrene Mardy CHRISTELLA, PA-C   Time spent: 50 minutes  Time in: 2: 00 p.m. time out: 2:50pm   Treatment:  individual therapy   Mental Status Exam:    Appearance:    Casual     Behavior:   Appropriate  Motor:   WNL  Speech/Language:    Clear and Coherent  Affect:   Full range   Mood:   Euthymic  Thought process:   Logical, linear, goal directed  Thought content:     WNL  Sensory/Perceptual disturbances:     none  Orientation:   x4  Attention:   Good  Concentration:   Good  Memory:   Intact  Fund of knowledge:    Consistent with age and development  Insight:     Good  Judgment:    Good  Impulse Control:   Good     Reported Symptoms:  sleep problems, irritability, panic attacks, agitation, depressed mood, low motivation, anhedonia  Risk Assessment: Danger to Self:  No Self-injurious Behavior: No Danger to Others: No Duty to Warn:no Physical Aggression / Violence:No  Access to Firearms a concern: No  Gang Involvement:No  Patient / guardian was educated about steps to take if suicide or homicide risk level increases between visits: yes While future psychiatric events cannot be accurately predicted, the patient does not currently require acute inpatient psychiatric care and does not currently meet [place]  involuntary commitment criteria.  Subjective: Patient arrived on time for today's session.  Assessed progress.  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  He shared his attempts to problem solve many situations to improve work stress.  He stated he continues to try and decompress in the evenings, utilizing cannabis to help him get rest but also shared how he has a few days per week recently where he has difficulty getting motivated in the mornings and identified  feeling overwhelmed, not sure where to start with all the many work tasks with which he is responsible.  Reviewed ways to cope and care for himself, including diaphragmatic breathing where he plans to utilize between visits.  Discussed nervous system responses, his identifying additional coping to allow for him to manage anxiety and stress more effectively.  interventions:   motivational interviewing, CBT, mindfulness strategies   Diagnoses:    ICD-10-CM   1. Generalized anxiety disorder  F41.1     2. Attention deficit hyperactivity disorder (ADHD), combined type  F90.2             Plan: Patient is to use CBT, mindfulness and coping skills to help manage /decrease symptoms.      Long-term goal:  Reduce overall level, frequency, and intensity of the feelings of depression, anxiety and panic so that daily functioning is not impaired.  Short-term goal: To identify and process feelings related to the disappointment of past painful events                    Improve ability to relax by utilizing coping skills as discussed        Identify and utilize emotional regulatory skills    Assessment of progress:  progressing     Lonni Fischer, [facility]

## 2024-07-30 ENCOUNTER — Ambulatory Visit: Admitting: Mental Health

## 2024-08-27 ENCOUNTER — Ambulatory Visit: Admitting: Mental Health

## 2024-10-01 ENCOUNTER — Ambulatory Visit: Admitting: Mental Health

## 2024-10-29 ENCOUNTER — Ambulatory Visit: Admitting: Mental Health
